# Patient Record
Sex: Female | Born: 1950 | Hispanic: Yes | Marital: Single | Smoking: Never smoker
Health system: Southern US, Community
[De-identification: ages and names within clinical notes are randomized; demographics above are authoritative.]

## PROBLEM LIST (undated history)

## (undated) DIAGNOSIS — Z0000 Encounter for general adult medical examination without abnormal findings: Secondary | ICD-10-CM

## (undated) DIAGNOSIS — C801 Malignant (primary) neoplasm, unspecified: Secondary | ICD-10-CM

## (undated) DIAGNOSIS — E119 Type 2 diabetes mellitus without complications: Secondary | ICD-10-CM

## (undated) HISTORY — DX: Malignant (primary) neoplasm, unspecified: C80.1

## (undated) HISTORY — DX: Encounter for general adult medical examination without abnormal findings: Z00.00

## (undated) HISTORY — PX: OVARY SURGERY: SHX727

## (undated) HISTORY — PX: BACK SURGERY: SHX140

---

## 1980-01-30 HISTORY — PX: TUBAL LIGATION: SHX77

## 2017-01-29 HISTORY — PX: TOTAL THYROIDECTOMY: SHX2547

## 2020-01-04 ENCOUNTER — Emergency Department (HOSPITAL_COMMUNITY)
Admission: EM | Admit: 2020-01-04 | Discharge: 2020-01-06 | Disposition: A | Discharge status: Home / Self Care | Payer: 59 | Attending: Emergency Medicine | Admitting: Emergency Medicine

## 2020-01-04 ENCOUNTER — Other Ambulatory Visit: Payer: Self-pay

## 2020-01-04 DIAGNOSIS — F419 Anxiety disorder, unspecified: Secondary | ICD-10-CM | POA: Insufficient documentation

## 2020-01-04 DIAGNOSIS — Z20822 Contact with and (suspected) exposure to covid-19: Secondary | ICD-10-CM | POA: Diagnosis not present

## 2020-01-04 DIAGNOSIS — F331 Major depressive disorder, recurrent, moderate: Secondary | ICD-10-CM | POA: Insufficient documentation

## 2020-01-04 DIAGNOSIS — G4700 Insomnia, unspecified: Secondary | ICD-10-CM | POA: Insufficient documentation

## 2020-01-04 DIAGNOSIS — E039 Hypothyroidism, unspecified: Secondary | ICD-10-CM | POA: Diagnosis not present

## 2020-01-04 DIAGNOSIS — F309 Manic episode, unspecified: Secondary | ICD-10-CM

## 2020-01-04 DIAGNOSIS — R443 Hallucinations, unspecified: Secondary | ICD-10-CM | POA: Insufficient documentation

## 2020-01-04 DIAGNOSIS — Z639 Problem related to primary support group, unspecified: Secondary | ICD-10-CM

## 2020-01-04 LAB — RAPID URINE DRUG SCREEN, HOSP PERFORMED
Amphetamines: NOT DETECTED
Barbiturates: NOT DETECTED
Benzodiazepines: NOT DETECTED
Cocaine: NOT DETECTED
Opiates: NOT DETECTED
Tetrahydrocannabinol: NOT DETECTED

## 2020-01-04 LAB — CBC WITH DIFFERENTIAL/PLATELET
Abs Immature Granulocytes: 0.03 10*3/uL (ref 0.00–0.07)
Basophils Absolute: 0.1 10*3/uL (ref 0.0–0.1)
Basophils Relative: 1 %
Eosinophils Absolute: 0.1 10*3/uL (ref 0.0–0.5)
Eosinophils Relative: 2 %
HCT: 37.1 % (ref 36.0–46.0)
Hemoglobin: 12.5 g/dL (ref 12.0–15.0)
Immature Granulocytes: 0 %
Lymphocytes Relative: 28 %
Lymphs Abs: 2 10*3/uL (ref 0.7–4.0)
MCH: 29.9 pg (ref 26.0–34.0)
MCHC: 33.7 g/dL (ref 30.0–36.0)
MCV: 88.8 fL (ref 80.0–100.0)
Monocytes Absolute: 0.5 10*3/uL (ref 0.1–1.0)
Monocytes Relative: 6 %
Neutro Abs: 4.6 10*3/uL (ref 1.7–7.7)
Neutrophils Relative %: 63 %
Platelets: 379 10*3/uL (ref 150–400)
RBC: 4.18 MIL/uL (ref 3.87–5.11)
RDW: 12.1 % (ref 11.5–15.5)
WBC: 7.4 10*3/uL (ref 4.0–10.5)
nRBC: 0 % (ref 0.0–0.2)

## 2020-01-04 LAB — COMPREHENSIVE METABOLIC PANEL
ALT: 35 U/L (ref 0–44)
AST: 24 U/L (ref 15–41)
Albumin: 4.5 g/dL (ref 3.5–5.0)
Alkaline Phosphatase: 64 U/L (ref 38–126)
Anion gap: 11 (ref 5–15)
BUN: 10 mg/dL (ref 8–23)
CO2: 27 mmol/L (ref 22–32)
Calcium: 9.9 mg/dL (ref 8.9–10.3)
Chloride: 100 mmol/L (ref 98–111)
Creatinine, Ser: 0.71 mg/dL (ref 0.44–1.00)
GFR, Estimated: >60 mL/min (ref >60)
Glucose, Bld: 128 mg/dL — ABNORMAL HIGH (ref 70–99)
Potassium: 3.5 mmol/L (ref 3.5–5.1)
Sodium: 138 mmol/L (ref 135–145)
Total Bilirubin: 0.8 mg/dL (ref 0.3–1.2)
Total Protein: 7.7 g/dL (ref 6.5–8.1)

## 2020-01-04 LAB — RESP PANEL BY RT-PCR (FLU A&B, COVID) ARPGX2
Influenza A by PCR: NEGATIVE
Influenza B by PCR: NEGATIVE
SARS Coronavirus 2 by RT PCR: NEGATIVE

## 2020-01-04 LAB — TSH: TSH: 13.205 u[IU]/mL — ABNORMAL HIGH (ref 0.350–4.500)

## 2020-01-04 LAB — ETHANOL: Alcohol, Ethyl (B): <10 mg/dL (ref <10)

## 2020-01-04 NOTE — ED Triage Notes (Signed)
Pt here from home with daughter , daughter states that her mom has not been sleeping states that she hears people that are not there in the house , pt use to be meds for anxiety and  therapy  but has not been on meds since she has been living with her daughter over the last 3 yrs

## 2020-01-04 NOTE — ED Notes (Signed)
Dinner Trays Ordered @ [phone].

## 2020-01-04 NOTE — ED Notes (Signed)
No SI or HI

## 2020-01-04 NOTE — ED Notes (Signed)
Pt has a 71 yr old in the home

## 2020-01-04 NOTE — ED Provider Notes (Signed)
[facility] EMERGENCY DEPARTMENT Provider Note   CSN: [id number] Arrival date & time: 01/04/20  4970     History No chief complaint on file.   Alicia Kirby is a 69 y.o. female.  HPI   History is very limited, the patient speaks Spanish only, the family members not at the bedside, there was a daughter that brought the patient to the ER, she is not available at this minute but report to the nurse was that the patient has a history of anxiety, has not been taking medications for some time, unknown the name of the medicine or how long it has been.  It appears that recently the patient has had some increasing insomnia, speaking to people that are not there, no other history at this time.  Level 5 caveat applies secondary to language barrier, also related to anxiety and hallucinations, translator will need to be used  The patient definitely is speaking quickly according to the interpreter the patient has got a bit pressured speech, she is answering questions stating that she does hear things around the house but states that the daughter hears them to, the patient is convinced that the house is haunted, she paces the house at night and appears very anxious.  She used to be on medications when she lived in [place] but no longer takes them, she does not feel acutely depressed, the family member is worried about safety stating that the patient will often pick things up and act like she is going to hit her or throw them.  She has not had self-injurious behavior.  No past medical history on file.  There are no problems to display for this patient.   Hypothyroidism   OB History   No obstetric history on file.     No family history on file.  Social History   Tobacco Use  . Smoking status: Not on file  Substance Use Topics  . Alcohol use: Not on file  . Drug use: Not on file    Home Medications Prior to Admission medications   Not on File    Allergies     Patient has no known allergies.  Review of Systems   Review of Systems  Unable to perform ROS: Psychiatric disorder    Physical Exam Updated Vital Signs BP (!) 152/45   Pulse 77   Temp 98 F (36.7 C) (Oral)   Resp 16   SpO2 96%   Physical Exam Vitals and nursing note reviewed.  Constitutional:      General: She is not in acute distress.    Appearance: She is well-developed.  HENT:     Head: Normocephalic and atraumatic.     Mouth/Throat:     Pharynx: No oropharyngeal exudate.  Eyes:     General: No scleral icterus.       Right eye: No discharge.        Left eye: No discharge.     Conjunctiva/sclera: Conjunctivae normal.     Pupils: Pupils are equal, round, and reactive to light.  Neck:     Thyroid: No thyromegaly.     Vascular: No JVD.  Cardiovascular:     Rate and Rhythm: Normal rate and regular rhythm.     Heart sounds: Normal heart sounds. No murmur heard.  No friction rub. No gallop.   Pulmonary:     Effort: Pulmonary effort is normal. No respiratory distress.     Breath sounds: Normal breath sounds. No wheezing or rales.  Abdominal:     General: Bowel sounds are normal. There is no distension.     Palpations: Abdomen is soft. There is no mass.     Tenderness: There is no abdominal tenderness.  Musculoskeletal:        General: No tenderness. Normal range of motion.     Cervical back: Normal range of motion and neck supple.  Lymphadenopathy:     Cervical: No cervical adenopathy.  Skin:    General: Skin is warm and dry.     Findings: No erythema or rash.  Neurological:     Mental Status: She is alert.     Coordination: Coordination normal.  Psychiatric:     Comments: This patient has some pressured speech, appears mildly manic, pacing the room, not responding to internal stimuli     ED Results / Procedures / Treatments   Labs (all labs ordered are listed, but only abnormal results are displayed) Labs Reviewed  COMPREHENSIVE METABOLIC PANEL -  Abnormal; Notable for the following components:      Result Value   Glucose, Bld 128 (*)    All other components within normal limits  TSH - Abnormal; Notable for the following components:   TSH 13.205 (*)    All other components within normal limits  RESP PANEL BY RT-PCR (FLU A&B, COVID) ARPGX2  ETHANOL  RAPID URINE DRUG SCREEN, HOSP PERFORMED  CBC WITH DIFFERENTIAL/PLATELET    EKG None  Radiology No results found.  Procedures Procedures (including critical care time)  Medications Ordered in ED Medications - No data to display  ED Course  I have reviewed the triage vital signs and the nursing notes.  Pertinent labs & imaging results that were available during my care of the patient were reviewed by me and considered in my medical decision making (see chart for details).    MDM Rules/Calculators/A&P                          This patient does have some signs of mania or increased anxiety, she does not appear to be acutely dangerous and more upset about the interaction with her daughter stating that they fight or interact poorly at home sometimes.  The daughter does report that she will leave the house and go to another house of a family member for days at a time without letting her know and some other erratic behavior.  Pt has elevated TSH - likely needs to have synthroid increased - currently at 25 mcg - doubt this is the entire cuase of the patients sx - awaiting psych recommendations -   Change of shift - care signed out to Dr. Eulis Foster to follow up results and disposition accordingly.  Final Clinical Impression(s) / ED Diagnoses Final diagnoses:  Anxiety  Insomnia, unspecified type  Mania ([place])  Hypothyroidism, unspecified type      Noemi Chapel, MD 01/04/20 1609

## 2020-01-05 MED ORDER — ACETAMINOPHEN 325 MG PO TABS: 650.0000 mg | ORAL_TABLET | ORAL | Status: DC | PRN

## 2020-01-05 MED ORDER — ONDANSETRON HCL 4 MG PO TABS: 4.0000 mg | ORAL_TABLET | Freq: Three times a day (TID) | ORAL | Status: DC | PRN

## 2020-01-05 MED ORDER — ZOLPIDEM TARTRATE 5 MG PO TABS: 5.0000 mg | ORAL_TABLET | Freq: Every evening | ORAL | Status: DC | PRN

## 2020-01-05 MED ORDER — ALUM & MAG HYDROXIDE-SIMETH 200-200-20 MG/5ML PO SUSP: 30.0000 mL | Freq: Four times a day (QID) | ORAL | Status: DC | PRN

## 2020-01-05 NOTE — ED Notes (Signed)
Writer spoke with pt using translator. Pt had concerns about medication that she takes at home. Pt states that when she got here staff took her mediation for safety reasons, but she has not been given anything in the morning. Pt also had some concerns about what was going on and why there were so many people outside the door. Pt made aware that these people were here for safety reasons. Pt verbalized understanding.   This Probation officer called pharmacy to ask about pt medication. Pharmacy states that when they spoke with her prior that she denied taking medication at home. Pharmacy also called pt listed pharmacy and states that the pt does not take an medication. Pt is stable at this time. Will try to communicate with family in the AM to get a clear understanding of medication regimen.
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
## 2020-01-05 NOTE — ED Notes (Signed)
Patient was found with home meds bag at beside and came out to nurse station requesting medication for HA and to advised of her home medication need; RN retrieved all meds and placed label and placed at nurses station; interpreter machine was used; RN will request home meds be placed due to unknown Dispo at this time-Monique,RN

## 2020-01-05 NOTE — ED Notes (Signed)
Daughter Janace Hoard called at [phone] and utilized Patent attorney, updates on patient's placement plan and status given. Per Angie, please communicate any updates on patient to her only.

## 2020-01-05 NOTE — ED Provider Notes (Signed)
Emergency Medicine Observation Re-evaluation Note  Alicia Kirby is a 69 y.o. female, seen on rounds today.  Pt initially presented to the ED for complaints of No chief complaint on file. Currently, the patient is talking on phone   Physical Exam  BP (!) 157/64 (BP Location: Right Arm)   Pulse 80   Temp 97.7 F (36.5 C) (Oral)   Resp 18   SpO2 100%  Physical Exam General: looks well Cardiac: normal rate Lungs: normal  Psych:  ED Course / MDM  EKG:    I have reviewed the labs performed to date as well as medications administered while in observation.  Recent changes in the last 24 hours include   Plan  Current plan is for admission for psychiatric treatment  Patient is not under full IVC at this time.   Fransico Meadow, Hershal Coria 01/05/20 [place], MD 01/06/20 [phone]

## 2020-01-05 NOTE — ED Notes (Signed)
TTS in process 

## 2020-01-05 NOTE — Progress Notes (Signed)
Pt meets inpatient criteria per Lindon Romp, FNP. Referral information has been sent to the the following geriatric psychiatric facilities for review:  [facility]  [facility]     Disposition will continue to assist with inpatient placement needs.   Audree Camel, MSW, LCSW, [place] Clinical Social Worker II Disposition CSW [phone]

## 2020-01-05 NOTE — ED Notes (Signed)
Lunch Tray Ordered @ 1036. 

## 2020-01-05 NOTE — ED Notes (Signed)
Home medications were found in a bag behind nurses station. This RN calling pharmacy to get medications placed in the computer to be administered to pt at correct times.

## 2020-01-06 DIAGNOSIS — Z639 Problem related to primary support group, unspecified: Secondary | ICD-10-CM

## 2020-01-06 MED ORDER — METFORMIN HCL 500 MG PO TABS
500.0000 mg | ORAL_TABLET | Freq: Two times a day (BID) | ORAL | Status: DC
  Administered 2020-01-06 (×2): 500 mg via ORAL
  Filled 2020-01-06 (×2): qty 1

## 2020-01-06 MED ORDER — HYDROCHLOROTHIAZIDE 25 MG PO TABS
25.0000 mg | ORAL_TABLET | Freq: Every day | ORAL | Status: DC
  Administered 2020-01-06: 25 mg via ORAL
  Filled 2020-01-06: qty 1

## 2020-01-06 MED ORDER — VITAMIN B-12 1000 MCG PO TABS
1000.0000 ug | ORAL_TABLET | Freq: Every day | ORAL | Status: DC
  Administered 2020-01-06: 1000 ug via ORAL
  Filled 2020-01-06: qty 1

## 2020-01-06 MED ORDER — ASPIRIN EC 81 MG PO TBEC
81.0000 mg | DELAYED_RELEASE_TABLET | Freq: Every day | ORAL | Status: DC
  Administered 2020-01-06: 81 mg via ORAL
  Filled 2020-01-06: qty 1

## 2020-01-06 MED ORDER — LEVOTHYROXINE SODIUM 25 MCG PO TABS
25.0000 ug | ORAL_TABLET | Freq: Every day | ORAL | Status: DC
  Administered 2020-01-06: 25 ug via ORAL
  Filled 2020-01-06: qty 1

## 2020-01-06 MED ORDER — LISINOPRIL 20 MG PO TABS
40.0000 mg | ORAL_TABLET | Freq: Every day | ORAL | Status: DC
  Administered 2020-01-06: 40 mg via ORAL
  Filled 2020-01-06 (×2): qty 2

## 2020-01-06 MED ORDER — LEVOTHYROXINE SODIUM 25 MCG PO TABS: 25.0000 ug | ORAL_TABLET | Freq: Every day | ORAL | Status: DC

## 2020-01-06 MED ORDER — GABAPENTIN 100 MG PO CAPS
100.0000 mg | ORAL_CAPSULE | Freq: Once | ORAL | Status: AC
  Administered 2020-01-06: 100 mg via ORAL
  Filled 2020-01-06: qty 1

## 2020-01-06 MED ORDER — AMLODIPINE BESYLATE 5 MG PO TABS
2.5000 mg | ORAL_TABLET | Freq: Every day | ORAL | Status: DC
  Administered 2020-01-06: 2.5 mg via ORAL
  Filled 2020-01-06: qty 1

## 2020-01-06 NOTE — BH Assessment (Addendum)
TTS to see-Alicia Kirby/Clinician awaiting nursing to coordinate interpreter services in order to complete patient's TTS re-assessment. Patients re-assessment is pending.

## 2020-01-06 NOTE — ED Notes (Signed)
Medications had not been reconciled upon the beginning of the shift. A med tech had responded to reconcile the meds. Pt is a type 2 diabetic. Meds were temporarily left with Pt. I obtained med's and  Secured. Pt stated that she had not taken any of her medications. Pt complained of rash to her hand, MD notifed.

## 2020-01-06 NOTE — BH Assessment (Addendum)
Disposition:   Patient's nurse-Allen, NP, made aware that patient is psych cleared by Harriett Sine, NP. Her daughter will pick patient up from the ED approximately 3pm. Informed nursing that it seems to be f conflict between patient and daughter. Patient expressed during today's re-assessment. she wants to get her own apartment. In the event that she refuses to return home to daughter nursing informed that social work may need to intervene with discharge.

## 2020-01-06 NOTE — ED Notes (Signed)
Breakfast ordered 

## 2020-01-06 NOTE — ED Provider Notes (Signed)
  Physical Exam  BP (!) 146/48 (BP Location: Right Arm)   Pulse 76   Temp 98 F (36.7 C) (Oral)   Resp 17   SpO2 98%   Physical Exam  ED Course/Procedures     Procedures  MDM  Patient was cleared by pscyh. Patient was cleared for discharge but she was concerned for her safety. APS was contacted by psych. I was told that she has a friend she can stay with and she feels safe there. Stable for discharge       Drenda Freeze, MD 01/06/20 2100

## 2020-01-06 NOTE — ED Notes (Signed)
Writer spoke with son, Camillia Herter. He currently resides in [place] and would like to be included in pt care. Pt is agreeable with the son being apart of plan. Phone number [phone].

## 2020-01-06 NOTE — Social Work (Signed)
TOC Team met with Pt at bedside. [employer] Interpreter system was used, interpreter  # [id number].  Pt reports that she has a new apartment that will be ready soon and until that time, she has friends that she can stay with. Pt states that she will call a friend to come and pick her up from ED.  TOC Team updated RN.
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
## 2020-01-06 NOTE — Consult Note (Signed)
Telepsych Consultation   Location of Patient: [facility] Location of Provider: [facility]  Patient Identification: Alicia Kirby MRN:  [id number] Principal Diagnosis: Conflict between patient and family Diagnosis:  Principal Problem:   Conflict between patient and family   Total Time spent with patient: 30 minutes  HPI:  Reassessment: Patient seen via telepsych. Chart reviewed. [name] is a 69 year old female who was brought to the ED by her daughter, who she lives with. Her daughter reported that she had a history of anxiety, had not been taking medicine, was talking to people who were not there, and believed the house was haunted.  Patient seen with TTS counselor Marlou Porch and Page language interpreter. Patient appears pleasant and euthymic. She states that she had been unaware that her daughter was taking her to the ED and does not feel that she ever needed to be brought here. She states that she has been having conflict with her daughter and her daughter's significant other. She states her daughter's significant other, who also lives in the home, has been controlling of her daughter, which has impacted the relationship between patient and her daughter. She states the daughter's significant other wanted her to be taken to the hospital. She denies depressed mood or anxiety and denies taking psychiatric medications. She does not feel she needs mental health treatment. She states she would like to move out of her daughter's home but does not have alternate housing available. She denies any SI/HI/AVH. Denies paranoia. She shows no signs of responding to internal stimuli. No signs of paranoia. No delusional thought content expressed. She has been calm and shown no behavioral disturbances in the ED. She reports that she has been sleeping well at night. Denies any concerns that her house is haunted.  With patient's consent, attempted to call her son at number in prior notes  [phone] but number did not work. With patient's consent, called her daughter Prentice Docker and informed her patient does not meet criteria for inpatient behavioral health at this time as she is denying any SI/HI/AVH. Her daughter states she is able to pick up patient for discharge home and denies safety concerns for discharge.  Per TTS assessment: Alicia Kirby is an 69 y.o. female.  -Clinician reviewed note by Dr. Sabra Heck.  History is very limited, the patient speaks Spanish only, the family members not at the bedside, there was a daughter that brought the patient to the ER, she is not available at this minute but report to the nurse was that the patient has a history of anxiety, has not been taking medications for some time, unknown the name of the medicine or how long it has been. It appears that recently the patient has had some increasing insomnia, speaking to people that are not there, no other history at this time.  Patient is seen with the language interpreter machine in the room.  Pt primary language is Spanish.  Patient says she has no SI, HI or A/V hallucinations.  She said that her daughter brought her to hospital under false pretenses.  Clinician specifically asked if she talked to people not present or thought that the house was haunted.  Patient says "no" to both os these inquiries.    Patient says that she and daughter are in conflict with each other.  Patient says she wants to be in her own residence away from daughter.  Right now however she and daughter and grandchild are the only ones in [place].    Patient is  alert and oriented x4.  Her eye contact is good and she is not responding to internal stimuli.  Patient does not display delusional thought processes.  She reports appetite and sleep being WNL.  Patient has no previous hx of inpatient or outpatient psychiatric care.  Clinician did try to call patient's daughter, with her permission.  But got no answer.     Disposition: Patient shows no evidence of acute risk of harm to self or others and is psych cleared for discharge. Daughter updated. ED staff updated.  Past Psychiatric History: See above  Risk to Self: Suicidal Ideation: No Suicidal Intent: No Is patient at risk for suicide?: No Suicidal Plan?: No Access to Means: No What has been your use of drugs/alcohol within the last 12 months?: Denies How many times?: 0 Other Self Harm Risks: None Triggers for Past Attempts: None known Intentional Self Injurious Behavior: None Risk to Others: Homicidal Ideation: No Thoughts of Harm to Others: No Current Homicidal Intent: No Current Homicidal Plan: No Access to Homicidal Means: No Identified Victim: No one History of harm to others?: No Assessment of Violence: None Noted Violent Behavior Description: None reported Does patient have access to weapons?: No Criminal Charges Pending?: No Does patient have a court date: No Prior Inpatient Therapy: Prior Inpatient Therapy: No Prior Outpatient Therapy: Prior Outpatient Therapy: No Does patient have an ACCT team?: No Does patient have Intensive In-House Services?  : No Does patient have [facility] services? : No Does patient have P4CC services?: No  Past Medical History: No past medical history on file.  Family History: No family history on file. Family Psychiatric  History: Unknown Social History:  Social History   Substance and Sexual Activity  Alcohol Use Not on file     Social History   Substance and Sexual Activity  Drug Use Not on file    Social History   Socioeconomic History  . Marital status: Single    Spouse name: Not on file  . Number of children: Not on file  . Years of education: Not on file  . Highest education level: Not on file  Occupational History  . Not on file  Tobacco Use  . Smoking status: Not on file  Substance and Sexual Activity  . Alcohol use: Not on file  . Drug use: Not on file  . Sexual  activity: Not on file  Other Topics Concern  . Not on file  Social History Narrative  . Not on file   Social Determinants of Health   Financial Resource Strain:   . Difficulty of Paying Living Expenses: Not on file  Food Insecurity:   . Worried About Charity fundraiser in the Last Year: Not on file  . Ran Out of Food in the Last Year: Not on file  Transportation Needs:   . Lack of Transportation (Medical): Not on file  . Lack of Transportation (Non-Medical): Not on file  Physical Activity:   . Days of Exercise per Week: Not on file  . Minutes of Exercise per Session: Not on file  Stress:   . Feeling of Stress : Not on file  Social Connections:   . Frequency of Communication with Friends and Family: Not on file  . Frequency of Social Gatherings with Friends and Family: Not on file  . Attends Religious Services: Not on file  . Active Member of Clubs or Organizations: Not on file  . Attends Archivist Meetings: Not on file  . Marital  Status: Not on file   Additional Social History:    Allergies:  No Known Allergies  Labs: No results found for this or any previous visit (from the past 48 hour(s)).  Medications:  Current Facility-Administered Medications  Medication Dose Route Frequency Provider Last Rate Last Admin  . acetaminophen (TYLENOL) tablet 650 mg  650 mg Oral Q4H PRN Caryl Ada K, PA-C      . alum & mag hydroxide-simeth (MAALOX/MYLANTA) 200-200-20 MG/5ML suspension 30 mL  30 mL Oral Q6H PRN Sofia, Leslie K, PA-C      . amLODipine (NORVASC) tablet 2.5 mg  2.5 mg Oral Daily Maudie Flakes, MD   2.5 mg at 01/06/20 1054  . aspirin EC tablet 81 mg  81 mg Oral Daily Maudie Flakes, MD   81 mg at 01/06/20 1050  . hydrochlorothiazide (HYDRODIURIL) tablet 25 mg  25 mg Oral Daily Maudie Flakes, MD   25 mg at 01/06/20 1050  . levothyroxine (SYNTHROID) tablet 25 mcg  25 mcg Oral Q0600 Maudie Flakes, MD      . lisinopril (ZESTRIL) tablet 40 mg  40 mg Oral  Daily Maudie Flakes, MD   40 mg at 01/06/20 1053  . metFORMIN (GLUCOPHAGE) tablet 500 mg  500 mg Oral BID WC Maudie Flakes, MD   500 mg at 01/06/20 1100  . ondansetron (ZOFRAN) tablet 4 mg  4 mg Oral Q8H PRN Sofia, Leslie K, PA-C      . vitamin B-12 (CYANOCOBALAMIN) tablet 1,000 mcg  1,000 mcg Oral Daily Maudie Flakes, MD   1,000 mcg at 01/06/20 1053  . zolpidem (AMBIEN) tablet 5 mg  5 mg Oral QHS PRN Fransico Meadow, PA-C       Current Outpatient Medications  Medication Sig Dispense Refill  . amLODipine (NORVASC) 2.5 MG tablet Take 2.5 mg by mouth daily.    Marland Kitchen aspirin EC 81 MG tablet Take 81 mg by mouth daily. Swallow whole.    . hydrochlorothiazide (HYDRODIURIL) 25 MG tablet Take 25 mg by mouth daily.    Marland Kitchen levothyroxine (SYNTHROID) 25 MCG tablet Take 25 mcg by mouth daily before breakfast.    . lisinopril (ZESTRIL) 40 MG tablet Take 40 mg by mouth daily.    . metFORMIN (GLUCOPHAGE) 500 MG tablet Take 500 mg by mouth 2 (two) times daily with a meal.    . vitamin B-12 (CYANOCOBALAMIN) 1000 MCG tablet Take 1,000 mcg by mouth daily.      Psychiatric Specialty Exam: Physical Exam  Review of Systems  Blood pressure (!) 146/48, pulse 76, temperature 98 F (36.7 C), temperature source Oral, resp. rate 17, SpO2 98 %.There is no height or weight on file to calculate BMI.  General Appearance: Casual  Eye Contact:  Good  Speech:  Normal Rate  Volume:  Normal  Mood:  Euthymic  Affect:  Appropriate and Congruent  Thought Process:  Coherent and Goal Directed  Orientation:  Full (Time, Place, and Person)  Thought Content:  Logical  Suicidal Thoughts:  No  Homicidal Thoughts:  No  Memory:  Immediate;   Fair Recent;   Fair Remote;   Fair  Judgement:  Fair  Insight:  Fair  Psychomotor Activity:  Normal  Concentration:  Concentration: Fair and Attention Span: Fair  Recall:  [employer] of Knowledge:  Fair  Language:  Fair  Akathisia:  No  Handed:  Right  AIMS (if indicated):      Assets:  Communication Skills Desire  for Improvement Housing Resilience Social Support  ADL's:  Intact  Cognition:  WNL  Sleep:       Disposition: Patient shows no evidence of acute risk of harm to self or others and is psych cleared for discharge. Daughter updated. ED staff updated.  This service was provided via telemedicine using a 2-way, interactive audio and video technology with the identified patient, Spanish language interpreter, TTS counselor Waldon Merl, and this Probation officer.  Connye Burkitt, NP 01/06/2020 2:56 PM

## 2020-01-06 NOTE — Discharge Instructions (Signed)
Please continue your current meds including your thyroid medicine.   See a counselor and your primary care doctor for follow up   Return to ER if you have worse anxiety, not feeling safe, hallucinations

## 2020-01-06 NOTE — ED Notes (Signed)
TTS

## 2020-01-06 NOTE — ED Notes (Signed)
RN spoke with patient via Interpreter machine regarding securing a ride and place to stay til her Apartment is ready; Pt is making phone calls at this time; EDP notified of impending d/c-Monique,RN

## 2020-01-06 NOTE — ED Provider Notes (Signed)
This is a 69 year old female seen on rounds today.  She is here for signs of mania and increased anxiety.  She is currently waiting for psych admission.  She is not under IVC at this time.  She does have elevated TSH in which her previous providers had noted with recommendation to increase her Synthroid.  However, apparently patient have not been taking her medication for the past several weeks.  Therefore, patient should resume her usual dose.  At this time, her only complaint is a rash to her right hand and burning sensation of her right arm which has been ongoing for approximately 15 days.  She denies prior history of shingles.  Examination of the right arm was noted for some mild skin irritation to the dorsum of the hand.  No obvious shingle rash noted.  Given the burn complaints, will provide gabapentin for symptom control.  4:50 PM The patient has been evaluated by psychiatry and is clearly for discharge psychiatrically.  Patient is also medically cleared.  Patient however voiced concern about returning back to her home as she does not feel safe living with her family members.  Social worker has interviewed and is involved but at this time there is no placement available she does not qualify for placement.  We will have adult protective service to be involved, and will offer patient shelter if she is willing otherwise she will be discharged back to her living domicile.  BP (!) 146/54 (BP Location: Left Arm)   Pulse 78   Temp 98.2 F (36.8 C) (Oral)   Resp 16   SpO2 100%   Results for orders placed or performed during the hospital encounter of 01/04/20  Resp Panel by RT-PCR (Flu A&B, Covid) Nasopharyngeal Swab   Specimen: Nasopharyngeal Swab; Nasopharyngeal(NP) swabs in vial transport medium  Result Value Ref Range   SARS Coronavirus 2 by RT PCR NEGATIVE NEGATIVE   Influenza A by PCR NEGATIVE NEGATIVE   Influenza B by PCR NEGATIVE NEGATIVE  Comprehensive metabolic panel  Result Value Ref  Range   Sodium 138 135 - 145 mmol/L   Potassium 3.5 3.5 - 5.1 mmol/L   Chloride 100 98 - 111 mmol/L   CO2 27 22 - 32 mmol/L   Glucose, Bld 128 (H) 70 - 99 mg/dL   BUN 10 8 - 23 mg/dL   Creatinine, Ser 0.71 0.44 - 1.00 mg/dL   Calcium 9.9 8.9 - 10.3 mg/dL   Total Protein 7.7 6.5 - 8.1 g/dL   Albumin 4.5 3.5 - 5.0 g/dL   AST 24 15 - 41 U/L   ALT 35 0 - 44 U/L   Alkaline Phosphatase 64 38 - 126 U/L   Total Bilirubin 0.8 0.3 - 1.2 mg/dL   GFR, Estimated >60 >60 mL/min   Anion gap 11 5 - 15  Ethanol  Result Value Ref Range   Alcohol, Ethyl (B) <10 <10 mg/dL  Urine rapid drug screen (hosp performed)  Result Value Ref Range   Opiates NONE DETECTED NONE DETECTED   Cocaine NONE DETECTED NONE DETECTED   Benzodiazepines NONE DETECTED NONE DETECTED   Amphetamines NONE DETECTED NONE DETECTED   Tetrahydrocannabinol NONE DETECTED NONE DETECTED   Barbiturates NONE DETECTED NONE DETECTED  CBC with Diff  Result Value Ref Range   WBC 7.4 4.0 - 10.5 K/uL   RBC 4.18 3.87 - 5.11 MIL/uL   Hemoglobin 12.5 12.0 - 15.0 g/dL   HCT 37.1 36 - 46 %   MCV 88.8 80.0 -  100.0 fL   MCH 29.9 26.0 - 34.0 pg   MCHC 33.7 30.0 - 36.0 g/dL   RDW 12.1 11.5 - 15.5 %   Platelets 379 150 - 400 K/uL   nRBC 0.0 0.0 - 0.2 %   Neutrophils Relative % 63 %   Neutro Abs 4.6 1.7 - 7.7 K/uL   Lymphocytes Relative 28 %   Lymphs Abs 2.0 0.7 - 4.0 K/uL   Monocytes Relative 6 %   Monocytes Absolute 0.5 0.1 - 1.0 K/uL   Eosinophils Relative 2 %   Eosinophils Absolute 0.1 0.0 - 0.5 K/uL   Basophils Relative 1 %   Basophils Absolute 0.1 0.0 - 0.1 K/uL   Immature Granulocytes 0 %   Abs Immature Granulocytes 0.03 0.00 - 0.07 K/uL  TSH  Result Value Ref Range   TSH 13.205 (H) 0.350 - 4.500 uIU/mL   No results found.    Domenic Moras, PA-C 01/06/20 1652    Maudie Flakes, MD 01/07/20 0700

## 2020-01-19 ENCOUNTER — Other Ambulatory Visit: Payer: Self-pay

## 2020-01-19 ENCOUNTER — Encounter (HOSPITAL_COMMUNITY): Payer: Self-pay

## 2020-01-19 ENCOUNTER — Emergency Department (HOSPITAL_COMMUNITY)
Admission: EM | Admit: 2020-01-19 | Discharge: 2020-01-20 | Disposition: A | Discharge status: Home / Self Care | Payer: 59 | Attending: Emergency Medicine | Admitting: Emergency Medicine

## 2020-01-19 DIAGNOSIS — R609 Edema, unspecified: Secondary | ICD-10-CM

## 2020-01-19 DIAGNOSIS — Z7984 Long term (current) use of oral hypoglycemic drugs: Secondary | ICD-10-CM | POA: Diagnosis not present

## 2020-01-19 DIAGNOSIS — E1140 Type 2 diabetes mellitus with diabetic neuropathy, unspecified: Secondary | ICD-10-CM | POA: Diagnosis not present

## 2020-01-19 DIAGNOSIS — I10 Essential (primary) hypertension: Secondary | ICD-10-CM | POA: Insufficient documentation

## 2020-01-19 DIAGNOSIS — Z79899 Other long term (current) drug therapy: Secondary | ICD-10-CM | POA: Diagnosis not present

## 2020-01-19 DIAGNOSIS — Z7982 Long term (current) use of aspirin: Secondary | ICD-10-CM | POA: Diagnosis not present

## 2020-01-19 DIAGNOSIS — R42 Dizziness and giddiness: Secondary | ICD-10-CM | POA: Diagnosis not present

## 2020-01-19 DIAGNOSIS — G629 Polyneuropathy, unspecified: Secondary | ICD-10-CM

## 2020-01-19 HISTORY — DX: Type 2 diabetes mellitus without complications: E11.9

## 2020-01-19 LAB — CBC
HCT: 39.5 % (ref 36.0–46.0)
Hemoglobin: 13.2 g/dL (ref 12.0–15.0)
MCH: 30.1 pg (ref 26.0–34.0)
MCHC: 33.4 g/dL (ref 30.0–36.0)
MCV: 90.2 fL (ref 80.0–100.0)
Platelets: 376 10*3/uL (ref 150–400)
RBC: 4.38 MIL/uL (ref 3.87–5.11)
RDW: 12.8 % (ref 11.5–15.5)
WBC: 9.3 10*3/uL (ref 4.0–10.5)
nRBC: 0 % (ref 0.0–0.2)

## 2020-01-19 LAB — BASIC METABOLIC PANEL
Anion gap: 11 (ref 5–15)
BUN: 17 mg/dL (ref 8–23)
CO2: 25 mmol/L (ref 22–32)
Calcium: 9.7 mg/dL (ref 8.9–10.3)
Chloride: 100 mmol/L (ref 98–111)
Creatinine, Ser: 0.82 mg/dL (ref 0.44–1.00)
GFR, Estimated: >60 mL/min (ref >60)
Glucose, Bld: 130 mg/dL — ABNORMAL HIGH (ref 70–99)
Potassium: 3.5 mmol/L (ref 3.5–5.1)
Sodium: 136 mmol/L (ref 135–145)

## 2020-01-19 NOTE — ED Triage Notes (Signed)
Per stratus interpretor patient complains of right foot swelling x 1 week. On assessment no swelling noted, also complains of left foot as well. Reports that her feet feel heavy. Patient alert and oriented. Also complains of right knuckle swelling x 2 weeks with dryness to same.

## 2020-01-20 ENCOUNTER — Emergency Department (HOSPITAL_COMMUNITY)
Admit: 2020-01-20 | Discharge: 2020-01-20 | Disposition: A | Discharge status: Home / Self Care | Payer: 59 | Attending: Emergency Medicine | Admitting: Emergency Medicine

## 2020-01-20 ENCOUNTER — Other Ambulatory Visit: Payer: Self-pay

## 2020-01-20 ENCOUNTER — Emergency Department (HOSPITAL_COMMUNITY): Payer: 59

## 2020-01-20 LAB — BRAIN NATRIURETIC PEPTIDE: B Natriuretic Peptide: 34.4 pg/mL (ref 0.0–100.0)

## 2020-01-20 MED ORDER — SODIUM CHLORIDE 0.9 % IV BOLUS
1000.0000 mL | Freq: Once | INTRAVENOUS | Status: AC
  Administered 2020-01-20: 17:00:00 1000 mL via INTRAVENOUS

## 2020-01-20 NOTE — ED Notes (Signed)
Son called and updated via phone on patient status.

## 2020-01-20 NOTE — Discharge Instructions (Addendum)
Continue taking your home medications as previously prescribed. It is important for you to follow-up with a primary care provider listed below for further evaluation of your chronic medical issues. Your CT did not show any evidence of a stroke or bleed.  Your lab work here did not show any significant abnormalities.   Return to the ER if you start to experience worsening swelling, chest pain, shortness of breath, blurry vision, numbness in arms or legs.  [facility] [place] [facility]: Wonda Horner [address] [place], [place] [address] [place] [place], [place] [address]  [id number], [place] [address] Correo estatal #: [id number] Telfono: [phone] Nmero de fax: [phone] Telfono de emergencia: [phone]  Si usted es un individuo o una familia que necesita asistencia para la vivienda debido a la falta de vivienda, comunquese con [place] [employer] llamando al 2-1-1 o visite su sitio web [id number].  Para obtener ms informacin Advertising account executive de viviendas para Special educational needs teacher u otros temas relacionados con la vivienda, comunquese con: Consultora de Programa de Raymond Gurney, [place] que acaban con la homelssness [phone]

## 2020-01-20 NOTE — ED Notes (Signed)
Reviewed discharge instructions with patient using interpreter. Follow-up care and social worker resources reviewed. Patient verbalized understanding. Patient A&Ox4, VSS, and ambulatory with steady gait upon discharge.

## 2020-01-20 NOTE — Progress Notes (Signed)
CSW met with Pt at bedside. With the utilization of [employer] video interpreter services # [phone] and # [phone] CSW was able to discuss with Pt the fact that Pt is currently staying with friends but will need other housing options soon.  CSW provided [place] housing and senior resource information translated into [place].  With interpreter CSW was able to explain to Pt process for reaching out to DSS for assistance.  CSW also included [facility] resource. CSW added resources to AVS. Pt verbalized understanding.  01/20/20 1952  TOC ED Mini Assessment  TOC Time spent with patient (minutes): 60  PING Used in TOC Assessment No  Admission or Readmission Diverted Yes  Interventions which prevented an admission or readmission Homeless Screening  What brought you to the Emergency Department?  Foot swelling  Barriers to Discharge No Barriers Identified  Means of departure Not know

## 2020-01-20 NOTE — ED Provider Notes (Signed)
Patient received in sign-out from [facility], PA-C. Patient awaiting completion of diagnostic studies in the evaluation of leg swelling. DVT, ECG, and BNP pending.  4:54 PM Received call from vascular that DVT study has been completed, but they are having trouble getting the report into EPIC. No indication of DVT.  6:45 PM Diagnostic studies and lab results reviewed and shared with patient. Patient endorses that she is homeless, and is requesting assistance. 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 TOC team consulted to assess/assist patient prior to discharge.  7:29 PM  TOC team has seen patient, with resource materials provided.  Results for orders placed or performed during the hospital encounter of 01/19/20  CBC  Result Value Ref Range   WBC 9.3 4.0 - 10.5 K/uL   RBC 4.38 3.87 - 5.11 MIL/uL   Hemoglobin 13.2 12.0 - 15.0 g/dL   HCT 39.5 36.0 - 46.0 %   MCV 90.2 80.0 - 100.0 fL   MCH 30.1 26.0 - 34.0 pg   MCHC 33.4 30.0 - 36.0 g/dL   RDW 12.8 11.5 - 15.5 %   Platelets 376 150 - 400 K/uL   nRBC 0.0 0.0 - 0.2 %  Basic metabolic panel  Result Value Ref Range   Sodium 136 135 - 145 mmol/L   Potassium 3.5 3.5 - 5.1 mmol/L   Chloride 100 98 - 111 mmol/L   CO2 25 22 - 32 mmol/L   Glucose, Bld 130 (H) 70 - 99 mg/dL   BUN 17 8 - 23 mg/dL   Creatinine, Ser 0.82 0.44 - 1.00 mg/dL   Calcium 9.7 8.9 - 10.3 mg/dL   GFR, Estimated >60 >60 mL/min   Anion gap 11 5 - 15  Brain natriuretic peptide  Result Value Ref Range   B Natriuretic Peptide 34.4 0.0 - 100.0 pg/mL   CT Head Wo Contrast  Result Date: 01/20/2020 CLINICAL DATA:  Vertigo.  Syncope. EXAM: CT HEAD WITHOUT CONTRAST TECHNIQUE: Contiguous axial images were obtained from the base of the skull through the vertex without intravenous contrast. COMPARISON:  None. FINDINGS: Brain: No evidence of acute large vascular territory infarction, hemorrhage,  hydrocephalus, extra-axial collection or mass lesion/mass effect. Bilateral basal ganglia mineralization. Partially empty sella. Vascular: Calcific atherosclerosis. Skull: No acute fracture. Sinuses/Orbits: Mild ethmoid air cell mucosal thickening. No air-fluid levels. Unremarkable orbits. Other: No mastoid or middle ear effusions. IMPRESSION: No evidence of acute intracranial abnormality. Electronically Signed   By: Margaretha Sheffield MD   On: 01/20/2020 13:42   VAS [place] LOWER EXTREMITY VENOUS (DVT) (ONLY [facility] & [facility] 7a-7p)  Result Date: 01/20/2020  Lower Venous DVT Study Other             BLE edema from knees to feet and pain going on for several Indications:      weeks. Performing Technologist: Rogelia Rohrer  Examination Guidelines: A complete evaluation includes B-mode imaging, spectral Doppler, color Doppler, and power Doppler as needed of all accessible portions of each vessel. Bilateral testing is considered an integral part of a complete examination. Limited examinations for reoccurring indications may be performed as noted. The reflux portion of the exam is performed with the patient in reverse Trendelenburg.  +---------+---------------+---------+-----------+----------+--------------+  RIGHT     Compressibility Phasicity Spontaneity Properties Thrombus Aging  +---------+---------------+---------+-----------+----------+--------------+  CFV       Full  Yes       Yes                                    +---------+---------------+---------+-----------+----------+--------------+  SFJ       Full                                                             +---------+---------------+---------+-----------+----------+--------------+  FV Prox   Full            Yes       Yes                                    +---------+---------------+---------+-----------+----------+--------------+  FV Mid    Full            Yes       Yes                                     +---------+---------------+---------+-----------+----------+--------------+  FV Distal Full            Yes       Yes                                    +---------+---------------+---------+-----------+----------+--------------+  PFV       Full                                                             +---------+---------------+---------+-----------+----------+--------------+  POP       Full            Yes       Yes                                    +---------+---------------+---------+-----------+----------+--------------+  PTV       Full                                                             +---------+---------------+---------+-----------+----------+--------------+  PERO      Full                                                             +---------+---------------+---------+-----------+----------+--------------+   +---------+---------------+---------+-----------+----------+--------------+  LEFT      Compressibility Phasicity Spontaneity Properties Thrombus Aging  +---------+---------------+---------+-----------+----------+--------------+  CFV       Full  Yes       Yes                                    +---------+---------------+---------+-----------+----------+--------------+  SFJ       Full                                                             +---------+---------------+---------+-----------+----------+--------------+  FV Prox   Full            Yes       Yes                                    +---------+---------------+---------+-----------+----------+--------------+  FV Mid    Full            Yes       Yes                                    +---------+---------------+---------+-----------+----------+--------------+  FV Distal Full            Yes       Yes                                    +---------+---------------+---------+-----------+----------+--------------+  PFV       Full                                                              +---------+---------------+---------+-----------+----------+--------------+  POP       Full            Yes       Yes                                    +---------+---------------+---------+-----------+----------+--------------+  PTV       Full                                                             +---------+---------------+---------+-----------+----------+--------------+  PERO      Full                                                             +---------+---------------+---------+-----------+----------+--------------+     Summary: RIGHT: - There is no evidence of deep vein thrombosis in the lower extremity.  - No cystic structure found in the popliteal fossa.  LEFT: - There is no evidence of deep vein thrombosis in the lower extremity.  - No cystic structure found in the popliteal fossa.  *See table(s) above for measurements and observations.    Preliminary      Patient appears safe for discharge at this time. Care instructions and return precautions provided.   Etta Quill, NP 01/20/20 1932    Drenda Freeze, MD 01/22/20 [phone]

## 2020-01-20 NOTE — ED Notes (Signed)
Pt transported to [facility] at this time

## 2020-01-20 NOTE — ED Provider Notes (Signed)
[facility] DEPART[facility] Provider Note   CSN: [id number] Arrival date & time: 01/19/20  1747     History No chief complaint on file.   Alicia Kirby is a 69 y.o. female with a past medical history of NIDDM, hypertension presenting to the ED with multiple complaints. Her first complaint is lightheadedness and dizziness for the past week.  She cannot recall any incident that may have triggered the symptoms although is concerned the symptoms may have been going on since her discharge at the hospital.  She was admitted earlier in the month for psych jobs.  She denies any headache, blurry vision, numbness in arms or legs, injuries or falls, anticoagulant use, vomiting, fever or neck stiffness.  She reports normal p.o. intake. Also complains of bilateral lower extremity edema, right greater than left.  This has been going on for several weeks.  She reports paresthesias to bilateral feet and pain to the bottom of her foot.  She has not taken medications to help with the symptoms.  She would like to be checked for a DVT as she is very afraid of this. Patient also requesting referral to a primary care provider.  The history is provided by the patient. The history is limited by a language barrier. A language interpreter was used.       Past Medical History:  Diagnosis Date  . Diabetes mellitus without complication [facility])     Patient Active Problem List   Diagnosis Date Noted  . Conflict between patient and family 01/06/2020    History reviewed. No pertinent surgical history.   OB History   No obstetric history on file.     No family history on file.     Home Medications Prior to Admission medications   Medication Sig Start Date End Date Taking? Authorizing Provider  amLODipine (NORVASC) 2.5 MG tablet Take 2.5 mg by mouth daily.    [provider]  aspirin EC 81 MG tablet Take 81 mg by mouth daily. Swallow whole.    [provider]   hydrochlorothiazide (HYDRODIURIL) 25 MG tablet Take 25 mg by mouth daily.    [provider]  levothyroxine (SYNTHROID) 25 MCG tablet Take 25 mcg by mouth daily before breakfast.    [provider]  lisinopril (ZESTRIL) 40 MG tablet Take 40 mg by mouth daily.    [provider]  metFORMIN (GLUCOPHAGE) 500 MG tablet Take 500 mg by mouth 2 (two) times daily with a meal.    [provider]  vitamin B-12 (CYANOCOBALAMIN) 1000 MCG tablet Take 1,000 mcg by mouth daily.    [provider]    Allergies    Patient has no known allergies.  Review of Systems   Review of Systems  Constitutional: Negative for appetite change, chills and fever.  HENT: Negative for ear pain, rhinorrhea, sneezing and sore throat.   Eyes: Negative for photophobia and visual disturbance.  Respiratory: Negative for cough, chest tightness, shortness of breath and wheezing.   Cardiovascular: Positive for leg swelling. Negative for chest pain and palpitations.  Gastrointestinal: Negative for abdominal pain, blood in stool, constipation, diarrhea, nausea and vomiting.  Genitourinary: Negative for dysuria, hematuria and urgency.  Musculoskeletal: Negative for myalgias.  Skin: Negative for rash.  Neurological: Positive for dizziness and light-headedness. Negative for weakness.    Physical Exam Updated Vital Signs BP (!) 95/45 (BP Location: Right Arm)   Pulse 64   Temp 97.6 F (36.4 C) (Oral)   Resp 18  SpO2 100%   Physical Exam Vitals and nursing note reviewed.  Constitutional:      General: She is not in acute distress.    Appearance: She is well-developed and well-nourished.  HENT:     Head: Normocephalic and atraumatic.     Nose: Nose normal.  Eyes:     General: No scleral icterus.       Right eye: No discharge.        Left eye: No discharge.     Extraocular Movements: EOM normal.     Conjunctiva/sclera: Conjunctivae normal.     Pupils: Pupils are equal, round,  and reactive to light.  Cardiovascular:     Rate and Rhythm: Normal rate and regular rhythm.     Pulses: Intact distal pulses.     Heart sounds: Normal heart sounds. No murmur heard. No friction rub. No gallop.   Pulmonary:     Effort: Pulmonary effort is normal. No respiratory distress.     Breath sounds: Normal breath sounds.  Abdominal:     General: Bowel sounds are normal. There is no distension.     Palpations: Abdomen is soft.     Tenderness: There is no abdominal tenderness. There is no guarding.  Musculoskeletal:        General: No edema. Normal range of motion.     Cervical back: Normal range of motion and neck supple.     Comments: No significant edema noted to bilateral lower extremities.  2+ DP pulse palpated bilaterally.  Normal sensation to light touch of bilateral lower extremities.  No erythema noted.  No calf tenderness bilaterally.  Skin:    General: Skin is warm and dry.     Findings: No rash.  Neurological:     Mental Status: She is alert and oriented to person, place, and time.     Cranial Nerves: No cranial nerve deficit.     Sensory: No sensory deficit.     Motor: No weakness or abnormal muscle tone.     Coordination: Coordination normal.     Comments: Pupils reactive. No facial asymmetry noted. Cranial nerves appear grossly intact. Sensation intact to light touch on face, BUE and BLE. Strength 5/5 in BUE and BLE.   Psychiatric:        Mood and Affect: Mood and affect normal.     ED Results / Procedures / Treatments   Labs (all labs ordered are listed, but only abnormal results are displayed) Labs Reviewed  BASIC METABOLIC PANEL - Abnormal; Notable for the following components:      Result Value   Glucose, Bld 130 (*)    All other components within normal limits  CBC  BRAIN NATRIURETIC PEPTIDE    EKG None  Radiology CT Head Wo Contrast  Result Date: 01/20/2020 CLINICAL DATA:  Vertigo.  Syncope. EXAM: CT HEAD WITHOUT CONTRAST TECHNIQUE:  Contiguous axial images were obtained from the base of the skull through the vertex without intravenous contrast. COMPARISON:  None. FINDINGS: Brain: No evidence of acute large vascular territory infarction, hemorrhage, hydrocephalus, extra-axial collection or mass lesion/mass effect. Bilateral basal ganglia mineralization. Partially empty sella. Vascular: Calcific atherosclerosis. Skull: No acute fracture. Sinuses/Orbits: Mild ethmoid air cell mucosal thickening. No air-fluid levels. Unremarkable orbits. Other: No mastoid or middle ear effusions. IMPRESSION: No evidence of acute intracranial abnormality. Electronically Signed   By: Margaretha Sheffield MD   On: 01/20/2020 13:42    Procedures Procedures (including critical care time)  Medications Ordered in ED Medications  sodium chloride 0.9 % bolus 1,000 mL (has no administration in time range)    ED Course  I have reviewed the triage vital signs and the nursing notes.  Pertinent labs & imaging results that were available during my care of the patient were reviewed by me and considered in my medical decision making (see chart for details).    MDM Rules/Calculators/A&P                          69 year old female with past medical history of NIDDM, hypertension presenting to the ED with multiple complaints. 1.  Lightheadedness and dizziness for the past week.  No trigger that may have caused the symptoms.  She is concerned that the symptoms have actually been going on since her discharge from the hospital.  She was admitted earlier in the month for psych work-up and consult.  She was not started on any new medications.  Denies any headache, blurry vision, numbness in arms or legs.  No neurological deficits noted on exam.  No numbness or weakness, no facial asymmetry noted.  She is alert and oriented x4. CT of the head without any acute abnormalities, no evidence of hemorrhage or stroke.  CBC, BMP unremarkable.  I doubt this is due to an acute  neurological cause such as CVA or hemorrhage. 2.  Bilateral lower extremity edema, right greater than left.  She reports paresthesias and pain to the bottom of her foot as well.  She is concerned that she may have a DVT and would like to be checked for this.  On my exam there is no significant edema noted to both of her extremities.  Equal and intact distal pulses noted bilaterally.  Normal temperature no erythema noted.    She is pending a DVT study, EKG and BNP. If all of these are negative for emergent concern which I anticipate, she will be able to be discharged home to follow-up with the primary care provider that we have referred her to.   Care handed off to oncoming provider pending remainder of work-up.   Portions of this note were generated with [employer]. Dictation errors may occur despite best attempts at proofreading.  Final Clinical Impression(s) / ED Diagnoses Final diagnoses:  Neuropathy  Peripheral edema    Rx / DC Orders ED Discharge Orders    None       Delia Heady, PA-C 01/20/20 1512    Tegeler, Gwenyth Allegra, MD 01/20/20 1550

## 2020-01-20 NOTE — CV Procedure (Signed)
BLE venous duplex completed. Preliminary results given to PA at 1650   Results can be found under chart review under CV PROC. 01/20/2020 5:01 PM Jasmon Graffam RVT, RDMS

## 2020-01-20 NOTE — ED Notes (Signed)
2 unsuccessful IV attempts.

## 2020-02-05 ENCOUNTER — Encounter: Payer: Self-pay | Admitting: Family Medicine

## 2020-02-05 ENCOUNTER — Ambulatory Visit (INDEPENDENT_AMBULATORY_CARE_PROVIDER_SITE_OTHER): Payer: 59 | Admitting: Family Medicine

## 2020-02-05 ENCOUNTER — Other Ambulatory Visit: Payer: Self-pay

## 2020-02-05 VITALS — BP 170/82 | HR 78 | Wt 145.2 lb

## 2020-02-05 DIAGNOSIS — E890 Postprocedural hypothyroidism: Secondary | ICD-10-CM | POA: Diagnosis not present

## 2020-02-05 DIAGNOSIS — Z0000 Encounter for general adult medical examination without abnormal findings: Secondary | ICD-10-CM

## 2020-02-05 DIAGNOSIS — F410 Panic disorder [episodic paroxysmal anxiety] without agoraphobia: Secondary | ICD-10-CM

## 2020-02-05 DIAGNOSIS — E7800 Pure hypercholesterolemia, unspecified: Secondary | ICD-10-CM | POA: Insufficient documentation

## 2020-02-05 DIAGNOSIS — E039 Hypothyroidism, unspecified: Secondary | ICD-10-CM | POA: Insufficient documentation

## 2020-02-05 DIAGNOSIS — Z23 Encounter for immunization: Secondary | ICD-10-CM | POA: Diagnosis not present

## 2020-02-05 DIAGNOSIS — E119 Type 2 diabetes mellitus without complications: Secondary | ICD-10-CM | POA: Insufficient documentation

## 2020-02-05 DIAGNOSIS — I152 Hypertension secondary to endocrine disorders: Secondary | ICD-10-CM | POA: Diagnosis not present

## 2020-02-05 DIAGNOSIS — E538 Deficiency of other specified B group vitamins: Secondary | ICD-10-CM

## 2020-02-05 DIAGNOSIS — Z1211 Encounter for screening for malignant neoplasm of colon: Secondary | ICD-10-CM

## 2020-02-05 DIAGNOSIS — Z1231 Encounter for screening mammogram for malignant neoplasm of breast: Secondary | ICD-10-CM

## 2020-02-05 DIAGNOSIS — E1159 Type 2 diabetes mellitus with other circulatory complications: Secondary | ICD-10-CM | POA: Diagnosis not present

## 2020-02-05 DIAGNOSIS — E559 Vitamin D deficiency, unspecified: Secondary | ICD-10-CM | POA: Diagnosis not present

## 2020-02-05 DIAGNOSIS — R7989 Other specified abnormal findings of blood chemistry: Secondary | ICD-10-CM

## 2020-02-05 DIAGNOSIS — Z1159 Encounter for screening for other viral diseases: Secondary | ICD-10-CM

## 2020-02-05 DIAGNOSIS — F325 Major depressive disorder, single episode, in full remission: Secondary | ICD-10-CM

## 2020-02-05 DIAGNOSIS — Z780 Asymptomatic menopausal state: Secondary | ICD-10-CM

## 2020-02-05 HISTORY — DX: Other specified abnormal findings of blood chemistry: R79.89

## 2020-02-05 HISTORY — DX: Deficiency of other specified B group vitamins: E53.8

## 2020-02-05 HISTORY — DX: Panic disorder (episodic paroxysmal anxiety): F41.0

## 2020-02-05 LAB — POCT URINALYSIS DIP (MANUAL ENTRY)
Bilirubin, UA: NEGATIVE
Blood, UA: NEGATIVE
Glucose, UA: NEGATIVE mg/dL
Ketones, POC UA: NEGATIVE mg/dL
Leukocytes, UA: NEGATIVE
Nitrite, UA: NEGATIVE
Protein Ur, POC: NEGATIVE mg/dL
Spec Grav, UA: 1.025 (ref 1.010–1.025)
Urobilinogen, UA: 0.2 E.U./dL
pH, UA: 7 (ref 5.0–8.0)

## 2020-02-05 LAB — POCT UA - MICROALBUMIN
Albumin/Creatinine Ratio, Urine, POC: <30
Creatinine, POC: 50 mg/dL
Microalbumin Ur, POC: 10 mg/L

## 2020-02-05 LAB — POCT GLYCOSYLATED HEMOGLOBIN (HGB A1C): Hemoglobin A1C: 5.8 % — AB (ref 4.0–5.6)

## 2020-02-05 NOTE — Assessment & Plan Note (Signed)
Lipid panel ordered for future as patient is non-fasting today.

## 2020-02-05 NOTE — Assessment & Plan Note (Addendum)
Pt currently on amlodipine 2.5 mg, HCTZ 25 mg, lisinopril 40 mg, and asa 81 mg daily. Endorses medication compliance, no missed doses. BP today 170/82.  - no medication changes at this time as new patient - return in one month or less for visit dedicated to BP - check CMP today for potential electrolyte abnormalities 2/2 HCTZ

## 2020-02-05 NOTE — Assessment & Plan Note (Addendum)
Historical diagnosis per patient. Currently on metformin 500 mg BID. A1c today 5.8, within prediabetic range. Today's UA and urine microalbumin demonstrate no renal complications 2/2 diabetes.  - CMP today - referred for annual DM eye exam - recheck A1c in 3 months - consider d/c metformin with shared decision making, as prediabetes could be successfully managed with diet and exercise

## 2020-02-05 NOTE — Progress Notes (Signed)
SUBJECTIVE:   CHIEF COMPLAINT / HPI:   Establish as new patient Alicia Kirby is a pleasant 70 yo female who presents today to establish as a new patient with our clinic. Previously a patient of Dr. Edwena Felty in [place]. Lives in [place] and would like a physician closer to her. Originally from [place], has received medical care there as recently as 2018/2019.   Anxiety, panic attack Reports an unprovoked panic attack a week or so ago. Occurred in the evening, consisted of tachycardia and heart pounding. Wasn't sure why this was happening, it frightened her. Her previous physician advised starting with a therapist, but she has not yet connected with one as she is not sure how. Asks for referral today. Is very interested and motivated to speak with a therapist.   Recent ED evaluations for anxiety, BLE edema, tingling sensation Recent ED evaluations at [facility] ED on 12/06 an 12/21 for anxiety, BLE edema, and a tingling sensation of her bilateral feet and right arm. She reports the work up was negative and they weren't able to tell her what happened. Per chart review, CT head, EKG, and DVT US all negative. Recent labs notable for TSH of 13.205 on 12/6 when patient presented with symptoms of anxiety and possible mania (per ED provider). History of surgical thyroid ablation in [place] in 2019 with subsequent need for levothyroxine supplementation since. Today patient reports that she has not missed any doses of levothyroxine recently.   PERTINENT  PMH / PSH: HTN, Prediabetes, surgical hypothyroidism, low vitamin B12  OBJECTIVE:   BP (!) 170/82   Pulse 78   Wt 145 lb 3.2 oz (65.9 kg)   SpO2 99%    PHQ-9:  Depression screen  [facility] 2/9 02/05/2020  Decreased Interest 0  Down, Depressed, Hopeless 0  PHQ - 2 Score 0  Altered sleeping 0  Tired, decreased energy 0  Change in appetite 1  Feeling bad or failure about yourself  0  Trouble concentrating 0  Moving slowly or  fidgety/restless 0  Suicidal thoughts 0  PHQ-9 Score 1  Difficult doing work/chores Not difficult at all     GAD-7: No flowsheet data found.    Physical Exam General: Awake, alert, oriented HEENT: moderate cerumen burden of bilateral external auditory canals, bilateral TMs pink and flat with appropriate cone of light, nares normal without erythema, oral mucosa without lesion, several dental fillings, silver metal linear brace under under tongue extending between bilateral lower canines Cardiovascular: Regular rate and rhythm, S1 and S2 present, no murmurs auscultated Respiratory: Lung fields clear to auscultation bilaterally Abdomen: soft, non-distended, BS present, no TTP, rebound tenderness, or guarding Extremities: No bilateral lower extremity edema, palpable pedal and pretibial pulses bilaterally Neuro: Cranial nerves II through X grossly intact, able to move all extremities spontaneously   ASSESSMENT/PLAN:   Hypothyroid 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 Current dose levothyroxine 25 mcg. Last TSH in ED 01/04/2020 was 13.205, however patient endorsed missing many doses at that time. - recheck TSH today as she has been taking levothyroxine regularly for last month - adjust as indicated  Hypertension associated with diabetes ([place]) Pt currently on amlodipine 2.5 mg, HCTZ 25 mg, lisinopril 40 mg, and asa 81 mg daily. Endorses medication compliance, no missed doses. BP today 170/82.  - no medication changes at this time as new patient - return in one month or less for visit dedicated to BP - check CMP today for potential electrolyte abnormalities 2/2  HCTZ  Low vitamin B12 level Has been taking B12 supplement for last year per recommendation of previous PCP.  - recheck B12 level today - adjust as necessary  Diabetes mellitus without complication ([place]) Historical diagnosis per patient. Currently on metformin 500 mg BID. A1c today 5.8, within prediabetic  range. Today's UA and urine microalbumin demonstrate no renal complications 2/2 diabetes.  - CMP today - referred for annual DM eye exam - recheck A1c in 3 months - consider d/c metformin with shared decision making, as prediabetes could be successfully managed with diet and exercise  Panic attack Patient reports increasing anxiety. Had panic attack a week or so ago that frightened her. Would like to see a therapist. Explained lack of therapy referral process and provided resources to connect with therapist of choice. PHQ-9 score today of 1.  - follow up in future visits - Spanish GAD-7 at next visit  Healthcare maintenance New patient establishing with our clinic today. Recent CBC performed at ED visits in December.  - sent MyChart setup links to phone and email - obtained CMP, A1c, TSH, vit B12, vit D - ordered lipid panel as future lab due to non-fasting status today - Hep. C screening today - PNA vax today - ref for colonoscopy - ordered mammogram and DEXA scan  Hypercholesteremia Lipid panel ordered for future as patient is non-fasting today.   Depression, major, single episode, complete remission (HCC) PHQ-9 score of 1 today. No medications. - follow up at future visits     Ezequiel Essex, MD [place]

## 2020-02-05 NOTE — Assessment & Plan Note (Signed)
Patient reports increasing anxiety. Had panic attack a week or so ago that frightened her. Would like to see a therapist. Explained lack of therapy referral process and provided resources to connect with therapist of choice. PHQ-9 score today of 1.  - follow up in future visits - Spanish GAD-7 at next visit

## 2020-02-05 NOTE — Patient Instructions (Signed)
Fue maravilloso conocerte hoy. Gracias por permitirme ser parte de su cuidado. A continuacin se muestra un breve resumen de lo que discutimos en su visita de hoy: It was wonderful to meet you today. Thank you for allowing me to be a part of your care. Below is a short summary of what we discussed at your visit today:  Mantenimiento sanitario Hoy obtuvimos muchos laboratorios para Chief Technology Officer su salud y Therapist, sports. Enviaremos sus resultados normales a travs de Pharmacist, community o Market researcher. Si algo es anormal, lo llamaremos para informarle los [place]. Healthcare maintenance Today we obtained many labs to check on your health and wellbeing. We will send your normal results through MyChart or letter. If anything is abnormal, we will call you with the results.  Recursos de terapia Puede visitar el siguiente sitio web para Merchandiser, retail en [place] que hablen espaol y que sean adecuados para usted. Tambin imprim las listas de sitios web despus de una breve bsqueda. Tendr que llamar a estas oficinas directamente para asegurarse de que tomen su seguro y programen una cita. https://www.psychologytoday.com/us/ Therapy resources You may visit the following website to find therapists in [place] who speak [place] may be a good fit for you. I have also printed off the website listings after a brief search. You will have to call these offices directly to make sure they take your insurance and schedule an appointment. https://www.psychologytoday.com/us/  Exmenes de [employer] referir para exmenes de salud como una colonoscopia para Film/video editor de colon, Lavinia Sharps para detectar cncer de mama, una exploracin DEXA para detectar la densidad sea y un examen de la vista para asegurarse de que sus ojos estn bien sin complicaciones de la diabetes. Debera tener noticias de nuestra oficina con respecto a estas referencias y Scientist, research (life sciences). Health screenings I will refer you for health screenings such as a  colonoscopy to screen for colon cancer, a mammogram to screen for breast cancer, a DEXA scan to screen for bone density, and eye exam to make sure your eyes are doing well without complications from diabetes. You should be hearing from our office regarding these referrals and scheduling.  Si tiene alguna pregunta o inquietud, no dude en comunicarse con nosotros por telfono o por mensaje de MyChart. If you have any questions or concerns, please do not hesitate to contact us via phone or MyChart message.   Ezequiel Essex, MD    Control de la ansiedad en los adultos Managing Anxiety, Adult Despus de haber sido diagnosticado con trastorno de ansiedad, podra sentirse aliviado por comprender por qu se haba sentido o haba actuado de cierto modo. Es posible que tambin se sienta abrumado por el tratamiento que tiene por delante y por lo que este significar para su vida. Con atencin y [place], [place] afeccin y recuperarse. Cmo manejar los cambios en el estilo de vida Control del estrs y la ansiedad  El estrs es la reaccin del cuerpo ante los cambios y los acontecimientos de la vida, tanto buenos [place]. La State Farm de los episodios de estrs duran slo algunas horas, pero el estrs puede ser continuo y Forensic psychologist a ms que solo estrs. Aunque el estrs puede desempear un papel importante en la ansiedad, no es lo mismo que la ansiedad. El estrs generalmente es causado por algo externo, como una fecha lmite, una prueba o una competencia. El estrs normalmente pasa despus de que el evento desencadenante ha terminado.  La ansiedad es causada por algo interno, por ejemplo, imaginar un resultado terrible  o preocuparse porque algo ir mal y lo devastar. A menudo, la ansiedad no desaparece incluso despus de que el evento desencadenante ha finalizado y puede tornarse en una preocupacin a largo plazo (crnica). Es importante comprender las diferencias entre el estrs y la ansiedad, y  Chief Technology Officer el estrs de manera efectiva para que no genere una respuesta de ansiedad. Hable con el mdico o un consejero para obtener ms informacin sobre cmo reducir la ansiedad y Dealer. Es posible que el profesional sugiera tcnicas para reducir la tensin, tales como:  Musicoterapia. Esto podra incluir crear o escuchar msica que disfrute y lo inspire.  Meditacin consciente. Esto implica prestar atencin a la respiracin normal sin intentar controlarla. Puede realizarse mientras est sentado o camina.  Oracin centrante. Esto implica centrarse en una palabra, frase o imagen sagrada que le signifique algo y le genere paz.  Respiracin profunda. Para hacer esto, expanda el estmago e inhale lentamente por la nariz. [place] unos 3a5segundos. Luego, exhale lentamente mientras deja que los msculos del estmago se relajen.  Dilogo interno. Esto implica identificar patrones de pensamiento que provocan reacciones de ansiedad y cambiar esos patrones.  Relajacin muscular. Esto implica tensar los msculos y, [place], [place]. Elija una tcnica para reducir la tensin que se adapte a su estilo de vida y su personalidad. Estas tcnicas llevan tiempo y prctica. Resrvese de 5a39minutos por da para Ambulance person. Algunos terapeutas pueden ofrecer orientacin y capacitacin en estas tcnicas. Es posible que algunos planes de seguros mdicos cubran la capacitacin. Otras cosas que puede hacer para controlar el estrs y la ansiedad incluyen:  Llevar un diario de estrs/ansiedad. Esto puede ayudarlo a identificar qu le desencadena su reaccin y, Air traffic controller, aprender las maneras de controlar su respuesta al [place].  Pensar en cmo reacciona ante ciertas situaciones. Es posible que no sea capaz de Chief Technology Officer todo, pero puede Environmental education officer.  Hacerse tiempo para las actividades que lo ayudan a Nurse, children's y no sentir culpa por pasar su tiempo de [place].  La formacin de  imgenes visuales y el yoga pueden ayudarlo a Theatre manager la calma y [place].  Medicamentos Los medicamentos pueden ayudar a [employer]. [employer]. [employer]. Algunos medicamentos para la ansiedad:  Teacher, adult education ansiedad.  Antidepresivos. A menudo, los medicamentos se usan como tratamiento primario para el trastorno de [place]. Un mdico recetar los medicamentos. Cuando se usan juntos, los medicamentos, la psicoterapia y las tcnicas de reduccin de la tensin pueden ser el tratamiento ms efectivo. [place] interpersonales pueden ser muy importantes para ayudar a su recuperacin. Intente pasar ms tiempo interactuando con amigos y familiares de [place]. Considere la posibilidad de ir a terapia de pareja, tomar clases de educacin familiar o ir a Careers information officer. La terapia puede ayudarlos a usted y a los dems a comprender mejor su afeccin. Cmo Museum/gallery conservator en su ansiedad Cada persona responde de [place] diferente al tratamiento de la ansiedad. Se dice que est recuperado de la ansiedad cuando los sntomas disminuyen y dejan de Cabin crew en las actividades diarias en el hogar o [place]. Esto podra significar que usted comenzar a Field seismologist lo siguiente:  Associate Professor y atencin. Tener menos interferencia de la preocupacin en el pensamiento diario.  Dormir mejor.  Estar menos irritable.  Tener ms energa.  Tener [employer]. Es Glass blower/designer cundo el trastorno [place]. Comunquese con el mdico si sus sntomas interfieren en su hogar o su trabajo, y usted siente que su afeccin no est  mejorando. Siga estas instrucciones en su casa: Actividad  Realizar actividad fsica. La State Farm de los adultos debe hacer lo siguiente: ? Optometrist, al [place], 167minutos de actividad fsica por semana. El ejercicio debe aumentar la frecuencia cardaca y Nature conservation officer transpirar (ejercicio de intensidad moderada). ? Realizar ejercicios de fortalecimiento por  lo [employer] por semana.  Dormir bien y por el tiempo adecuado. La State Farm de los adultos necesitan entre 7y9horas de sueo todas las noches. Estilo de vida   Siga una dieta saludable que incluya abundantes frutas, verduras, cereales integrales, productos lcteos descremados y protenas magras. No consuma muchos alimentos ricos en grasas slidas, azcares agregados o sal.  Opte por cosas que le simplifiquen la vida.  No consuma ningn producto que contenga nicotina o tabaco, como cigarrillos, cigarrillos electrnicos y tabaco de Higher education careers adviser. Si necesita ayuda para dejar de fumar, consulte al mdico.  Evite el consumo de cafena, alcohol y ciertos medicamentos contra el resfro de venta sin receta. Estos podran Engineer, building services. Pregntele al farmacutico qu medicamentos no debera tomar. Instrucciones generales  Delphi de venta libre y los recetados solamente como se lo haya indicado el mdico.  Consulting civil engineer a todas las visitas de seguimiento como se lo haya indicado el mdico. Esto es importante. Dnde buscar apoyo Puede conseguir ayuda y [employer] siguientes lugares:  Grupos de [place].  Organizaciones comunitarias y en lnea.  Un lder espiritual de confianza.  Terapia de pareja.  Clases de educacin familiar.  Terapia familiar. Dnde buscar ms informacin Formar parte de un grupo de apoyo podra resultarle til para enfrentar la ansiedad. Las siguientes fuentes pueden ayudarlo a Orthoptist consejeros o grupos de apoyo cerca de su hogar:  [place] (Salud Mental de los Estados Unidos): www.mentalhealthamerica.net  Anxiety and Depression Association of [place] [ADAA] (Asociacin de Ansiedad y Depresin de los Estados Unidos): https://www.clark.net/  National Alliance on Mental Illness [NAMI] ([place]): www.nami.org Comunquese con un mdico si:  Le resulta difcil permanecer concentrado o finalizar las tareas  diarias.  Pasa muchas horas por da sintindose preocupado por la vida cotidiana.  La preocupacin le provoca un cansancio extremo.  Comienza a tener dolores de [place] o nuseas, o a sentirse tenso.  Orina ms de lo normal.  Tiene diarrea. Solicite ayuda inmediatamente si tiene:  Latidos cardacos acelerados y falta de aire.  Pensamientos acerca de Sport and exercise psychologist a Producer, television/film/video. Si alguna vez siente que puede lastimarse o Market researcher a Producer, television/film/video, o tiene pensamientos de poner fin a su vida, busque ayuda de inmediato. Puede dirigirse al servicio de emergencias ms cercano o comunicarse con:  El servicio de emergencias de su localidad (911 en EE.UU.).  Una lnea de asistencia al suicida y Freight forwarder en crisis, como National Suicide Prevention Lifeline ([place]), al (607) 245-2211. Est disponible las 24 horas del da. Resumen  Tomar medidas para aprender y usar tcnicas de reduccin de la tensin puede ayudarlo a calmarse y a Conservation officer, historic buildings reaccin de ansiedad.  Cuando se usan juntos, los medicamentos, la psicoterapia y las tcnicas de reduccin de la tensin pueden ser el tratamiento ms efectivo.  Los familiares, los amigos y las parejas pueden tener un lugar importante en su recuperacin del trastorno de ansiedad. Esta informacin no tiene Marine scientist el consejo del mdico. Asegrese de hacerle al mdico cualquier pregunta que tenga. Document Revised: 07/23/2018 Document Reviewed: 07/23/2018 Elsevier Patient Education  [place]  and Counseling Resources Most providers on this list will take Medicaid. Patients with commercial insurance or Medicare should contact their insurance company to get a list of in network providers.  BestDay:Psychiatry and Counseling [address] [facility] [address]. [place], [place] [address] [place]  [address] [place], [place] [address]       [place] [address]  [place], [place] [address] [phone]  [facility] [address] [place]  [place], [place] [address]       [phone]      Jinny Blossom [facility] [address], [place], [place]  Family Solutions:  [place]. [place] [phone]  Journeys Counseling:  [place] [address] Fate [phone]  [facility] (under & uninsured) [address], [place] [phone]    [email or web address]    [place] [address] . [place]    [phone]  [facility] [place]     Phone:  [phone]     [place]  [place] [address]      [place], [place] ext [place]: [place], [place], [place]   [place] ([place] therapist) [email or web address]  [place] [address]   [place] [address]    [phone]    [facility]   [address],  [place], [place]   [place]  [place]  [facility]  [address] [place], [place]        [phone]  Open Access/Walk In Clinic under & uninsured  [facility]  [address] [place], [place] Crisis [phone]  Family Service of the [place],  ([place])   [place], [place]: [phone]) 8:30 - 12; 1 - 2:30  Family Service of the [place],  [place], [place]    ([phone]):8:30 - 12; 2 - 3PM  [facility] [employer],  [address],  [place]; [phone]):   Mon - Fri 8 AM - 5 PM  [facility] Services [place]  MWF 12:30  to 3:00 or call to schedule an appointment  [phone]  Specific Provider options Psychology Today  https://www.psychologytoday.com/us 1. click on find a therapist  2. enter your zip code 3. left side and select or tailor a therapist for your specific need.   [facility] Provider Directory [email or web address]  (Medicaid)   Follow all drop down to find a provider  [place] [phone] or [email or web address] [address], [place], [place] Recovery support and educational   24- Hour Availability:  .  Marland Kitchen [facility]  . [place], [place] Crisis [phone]  . Family Service of the [employer] [phone]  [facility] Crisis Service  [phone]   . [place]  [phone] (after hours)  . Therapeutic Alternative/Mobile Crisis   [phone]  . [place] National Suicide Hotline  325-142-4497 ([place])  . Call 911 or go to emergency room  . [employer]  [phone]);  [place] and  [place]   . [place] ACCESS  [phone]); [place], [place], [place], [place], [place], [place], [place]

## 2020-02-05 NOTE — Assessment & Plan Note (Signed)
PHQ-9 score of 1 today. No medications. - follow up at future visits

## 2020-02-05 NOTE — Assessment & Plan Note (Signed)
Has been taking B12 supplement for last year per recommendation of previous PCP.  - recheck B12 level today - adjust as necessary

## 2020-02-05 NOTE — Assessment & Plan Note (Addendum)
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 Current dose levothyroxine 25 mcg. Last TSH in ED 01/04/2020 was 13.205, however patient endorsed missing many doses at that time. - recheck TSH today as she has been taking levothyroxine regularly for last month - adjust as indicated

## 2020-02-05 NOTE — Assessment & Plan Note (Signed)
New patient establishing with our clinic today. Recent CBC performed at ED visits in December.  - sent MyChart setup links to phone and email - obtained CMP, A1c, TSH, vit B12, vit D - ordered lipid panel as future lab due to non-fasting status today - Hep. C screening today - PNA vax today - ref for colonoscopy - ordered mammogram and DEXA scan

## 2020-02-06 LAB — HCV AB W REFLEX TO QUANT PCR: HCV Ab: <0.1 s/co ratio (ref 0.0–0.9)

## 2020-02-06 LAB — HEMOGLOBIN A1C
Est. average glucose Bld gHb Est-mCnc: 131 mg/dL
Hgb A1c MFr Bld: 6.2 % — ABNORMAL HIGH (ref 4.8–5.6)

## 2020-02-06 LAB — VITAMIN B12: Vitamin B-12: 1098 pg/mL (ref 232–1245)

## 2020-02-06 LAB — VITAMIN D 25 HYDROXY (VIT D DEFICIENCY, FRACTURES): Vit D, 25-Hydroxy: 17.2 ng/mL — ABNORMAL LOW (ref 30.0–100.0)

## 2020-02-06 LAB — HCV INTERPRETATION

## 2020-02-06 LAB — TSH: TSH: 19.8 u[IU]/mL — ABNORMAL HIGH (ref 0.450–4.500)

## 2020-02-08 ENCOUNTER — Telehealth: Payer: Self-pay | Admitting: Family Medicine

## 2020-02-08 ENCOUNTER — Other Ambulatory Visit: Payer: Self-pay | Admitting: Family Medicine

## 2020-02-08 ENCOUNTER — Encounter: Payer: Self-pay | Admitting: Family Medicine

## 2020-02-08 DIAGNOSIS — E890 Postprocedural hypothyroidism: Secondary | ICD-10-CM

## 2020-02-08 DIAGNOSIS — E559 Vitamin D deficiency, unspecified: Secondary | ICD-10-CM

## 2020-02-08 MED ORDER — LEVOTHYROXINE SODIUM 75 MCG PO TABS: 75.0000 ug | ORAL_TABLET | ORAL | 0 refills | Status: DC

## 2020-02-08 MED ORDER — CHOLECALCIFEROL 1.25 MG (50000 UT) PO TABS: 50000.0000 [IU] | ORAL_TABLET | ORAL | 0 refills | Status: DC

## 2020-02-08 NOTE — Addendum Note (Signed)
Addended by: Renard Hamper on: 02/08/2020 04:11 PM   Modules accepted: Orders

## 2020-02-08 NOTE — Progress Notes (Signed)
Several labs returned abnormal. A1c 6.2, in pre-diabetic range. Will counsel patient on diet and exercise. Continue metformin 500 mg BID for now.   TSH 19.800, increased from 13.205 one month ago. Per clinic visit 02/05/2020, patient reports not missing any doses since ED visit 01/04/2020. Reports taking first thing in the morning and waiting 30 minutes before eating or drinking. Will increase levothyroxine from 25 mcg to 75 mcg and follow up in two months.   Vitamin D level at deficient level at 17.2. Will prescribe 50,000 units weekly x 8 weeks. Would like to follow up in two months or so for repeat Vit. D level at the same time we get repeat TSH.   Will send result letter and call patient.   Ezequiel Essex, MD

## 2020-02-08 NOTE — Telephone Encounter (Signed)
Used the telephone interpeter service to call Alicia Kirby to discuss her lab results. Interpreter Alicia Kirby KM#[id number]. We discussed the following:  - A1c 6.2, using diet and exercise to control. Potentially stopping metformin at next visit.  - TSH high, needing to increase levothyroxine from 25 mcg to 75 mcg with two month f/u for recheck - Vit. D low, starting weekly supplement x 8 weeks, with 2 month f/u and recheck  Patient relayed she is scared and nervous about this news. When asked to explain further, she relays there is a lot of stress in her life. She isn't driving yet; she has a car but is waiting for the social security card to arrive in the mail before she can take her driver's test. She is stressed about the drivers test. She is also anxious about her granddaughter, there is some sort of situation where "she keeps getting pulled into cars by men, so I'm really cautious about that".   All questions answered. Scripts sent to pharmacy. Called pharmacy to ensure they could provide patient education materials and instructions in Spanish.   Ezequiel Essex, MD

## 2020-02-11 ENCOUNTER — Ambulatory Visit: Payer: 59

## 2020-02-23 ENCOUNTER — Ambulatory Visit (INDEPENDENT_AMBULATORY_CARE_PROVIDER_SITE_OTHER): Payer: 59 | Admitting: Family Medicine

## 2020-02-23 ENCOUNTER — Other Ambulatory Visit: Payer: Self-pay

## 2020-02-23 VITALS — BP 118/82 | HR 65

## 2020-02-23 DIAGNOSIS — J069 Acute upper respiratory infection, unspecified: Secondary | ICD-10-CM | POA: Diagnosis not present

## 2020-02-23 DIAGNOSIS — U071 COVID-19: Secondary | ICD-10-CM

## 2020-02-23 DIAGNOSIS — J029 Acute pharyngitis, unspecified: Secondary | ICD-10-CM | POA: Diagnosis not present

## 2020-02-23 HISTORY — DX: Acute upper respiratory infection, unspecified: J06.9

## 2020-02-23 LAB — SARS CORONAVIRUS 2 (TAT 6-24 HRS): SARS Coronavirus 2: POSITIVE — AB

## 2020-02-23 NOTE — Progress Notes (Signed)
    SUBJECTIVE:   CHIEF COMPLAINT / HPI:   Viral respiratory infection [name] notes that her granddaughter who lives at home was sick with a viral illness about 2 weeks ago which included fever, cough and congestion.  This past Thursday (5 days ago) [name] for started noticing headache, sore throat, cough.  She did have an elevated temperature up to 100.0 over the weekend.  This improved with Tylenol.  She is also been experiencing some muscle aches and pains.  She denies shortness of breath or chest pain.  Fully vaccinated against Covid (3 vaccines)  PERTINENT  PMH / PSH: Diabetes, history of thyroid cancer s/p thyroidectomy  OBJECTIVE:   BP 118/82   Pulse 65   SpO2 98%   General: Alert and cooperative and appears to be in no acute distress HEENT: Moist mucous membranes Cardio: Normal S1 and S2, no S3 or S4. Rhythm is regular. No murmurs or rubs.   Pulm: Clear to auscultation bilaterally, no crackles, wheezing, or diminished breath sounds. Normal respiratory effort Abdomen: Bowel sounds normal. Abdomen soft and non-tender.  Extremities: No peripheral edema. Warm/ well perfused.  Strong radial pulses. Neuro: Cranial nerves grossly intact  ASSESSMENT/PLAN:   Viral upper respiratory tract infection with cough History and physical exam are consistent with a viral respiratory tract infection.  No shortness of breath or hypoxia at this time.  No chest pain.  Covid or flu remain highest on the differential.  Stat Covid swab obtained today.  She may be eligible for additional therapies if we find a positive result by tomorrow.  She was encouraged to take Tylenol for fever or discomfort and stay well-hydrated.  Her risk factors for severe disease include diabetes, history of cancer.     Matilde Haymaker, MD [place]

## 2020-02-23 NOTE — Patient Instructions (Signed)
Based on your symptoms, I think it is very likely that you have Covid.  We are going to get test today.  Hopefully, we will have the results by tomorrow.  If you are positive, I will send an order to see if you qualify for additional therapy.  For now, I recommend you continue to use Tylenol for fever or discomfort and drink plenty of fluids.  Please call the clinic or come back in if you are experiencing significant shortness of breath or chest pain.  egn sus sntomas, creo que es muy probable que tenga Covid. Vamos a hacernos la prueba hoy. Con suerte, tendremos los resultados para [place]. Si es positivo, enviar una orden para ver si califica para terapia adicional. Por ahora, le recomiendo que siga usando Tylenol para la fiebre o las molestias y que beba mucho lquido. Llame a la clnica o regrese si experimenta dificultad para respirar significativa o dolor en el pecho.

## 2020-02-23 NOTE — Assessment & Plan Note (Signed)
History and physical exam are consistent with a viral respiratory tract infection.  No shortness of breath or hypoxia at this time.  No chest pain.  Covid or flu remain highest on the differential.  Stat Covid swab obtained today.  She may be eligible for additional therapies if we find a positive result by tomorrow.  She was encouraged to take Tylenol for fever or discomfort and stay well-hydrated.  Her risk factors for severe disease include diabetes, history of cancer.

## 2020-02-24 NOTE — Progress Notes (Signed)
[name] was called and informed of her positive Covid test.  She notes that she has not had any worsening symptoms or shortness of breath.  She is informed that she is safe and appropriate to continue convalescing at home.

## 2020-02-24 NOTE — Addendum Note (Signed)
Addended by: Grant Ruts on: 02/24/2020 12:28 PM   Modules accepted: Orders

## 2020-04-10 ENCOUNTER — Other Ambulatory Visit: Payer: Self-pay | Admitting: Family Medicine

## 2020-04-10 DIAGNOSIS — E559 Vitamin D deficiency, unspecified: Secondary | ICD-10-CM

## 2020-04-11 ENCOUNTER — Telehealth: Payer: Self-pay | Admitting: Family Medicine

## 2020-04-11 DIAGNOSIS — E890 Postprocedural hypothyroidism: Secondary | ICD-10-CM

## 2020-04-11 DIAGNOSIS — E559 Vitamin D deficiency, unspecified: Secondary | ICD-10-CM

## 2020-04-11 NOTE — Telephone Encounter (Signed)
Attempted to contact patient regarding her recent refill request for vitamin D. Used Child psychotherapist, Kristeen Miss [phone]. At this time, I decline to refill the vitamin D script. I would like her to come in to have labs drawn for vitamin D level and TSH.   She will coordinate with her daughter to provide a ride and will call the office one day in advance to let the lab know when she is coming.   Ezequiel Essex, MD

## 2020-04-14 NOTE — Patient Instructions (Signed)
Vrtigo Vertigo El vrtigo es la sensacin de que usted o las cosas que lo rodean se mueven cuando en realidad eso no sucede. Esta sensacin puede aparecer y desaparecer en cualquier momento. El vrtigo suele desaparecer solo. Esta afeccin puede ser peligrosa si ocurre cuando est realizando actividades como conducir o trabajar con mquinas. El mdico le har pruebas para encontrar la causa del vrtigo. Estas pruebas tambin ayudarn al mdico a decidir cul es el mejor tratamiento para usted. Siga estas indicaciones en su casa: Comida y bebida  Beba suficiente lquido para mantener el pis (la [place]) de color amarillo plido.  No beba alcohol.      Actividad  Retome sus actividades habituales como se lo haya indicado el mdico. Pregntele al mdico qu actividades son seguras para usted.  Por la maana, sintese primero a un lado de la cama. Cuando se sienta bien, pngase lentamente de pie mientras se sostiene de algo, hasta que sepa que ha logrado el equilibrio.  Muvase lentamente. Evite determinadas posiciones o hacer movimientos repentinos del cuerpo o de la cabeza, como se lo haya indicado el mdico.  Use un bastn si tiene dificultad para ponerse de pie o caminar.  Si se siente mareado, sintese de inmediato.  Evite realizar tareas o actividades que puedan causarle peligro a usted o a Producer, television/film/video si se marea.  Evite agacharse si se siente mareado. En su casa, coloque los objetos de modo que le resulte fcil alcanzarlos sin Office manager.  No conduzca vehculos ni opere maquinaria pesada si se siente mareado. Indicaciones generales  [employer] de venta libre y los recetados solamente como se lo haya indicado el mdico.  Consulting civil engineer a todas las visitas de control como se lo haya indicado el mdico. Esto es importante. Comunquese con un mdico si:  Los medicamentos no le [employer] vrtigo.  Tiene fiebre.  Los problemas empeoran o le aparecen sntomas  nuevos.  Sus familiares o amigos observan cambios en su comportamiento.  La sensacin de Air cabin crew.  Los vmitos empeoran.  Pierde la sensibilidad (tiene entumecimiento) en alguna parte del cuerpo.  Siente pinchazos u hormigueo en alguna parte del cuerpo. Solicite ayuda inmediatamente si:  Tiene dificultad para moverse o para caminar.  Esta mareado todo [employer].  Pierde el conocimiento (se desmaya).  Tiene dolores de [place] muy intensos.  Siente debilidad [employer], los brazos o las piernas.  Tiene cambios en la audicin.  Tiene cambios en la forma de ver (visin).  Tiene rigidez en el cuello.  La luz brillante empieza a molestarlo. Resumen  El vrtigo es la sensacin de que usted o las cosas que lo rodean se mueven cuando en realidad eso no sucede.  El mdico le har pruebas para encontrar la causa del vrtigo.  Tal vez le indiquen que evite algunas tareas, posiciones o movimientos.  Comunquese con un mdico si los medicamentos no lo ayudan o si tiene fiebre, sntomas nuevos o un cambio en el comportamiento.  Pida ayuda de inmediato si tiene dolores de cabeza muy intensos o si tiene cambios en la manera de hablar, or o ver. Esta informacin no tiene Marine scientist el consejo del mdico. Asegrese de hacerle al mdico cualquier pregunta que tenga. Document Revised: 02/03/2018 Document Reviewed: 02/03/2018 Elsevier Patient Education  [place].

## 2020-04-14 NOTE — Progress Notes (Signed)
    SUBJECTIVE:   Translated by Shanon Brow [phone]  CHIEF COMPLAINT / HPI:   Ear pain  Dizziness: Pain reports she started having pain to her right ear about 15 days ago.  She also reports that since then her hearing has also slightly decreased.  She denies any traumas to her ear or getting hit, denies drainage or bleeding, fevers, body aches, chills, nasal congestion, runny nose.  She denies any jaw pain or pain with chewing.   Dizziness: Patient reports that about 15 days ago she started having the sensation as if the room was occasionally spinning.  One of her triggers is when she is laying on one side and then rolls over to the other side.  Patient reports the room spins for about 1 minute and stops on its own; however, she continues to feel somewhat lightheaded after that.  She denies having any upper respiratory symptoms 15 days ago when the symptoms started.  Patient did have Covid in late January/early February.    PERTINENT  PMH / PSH:  Patient Active Problem List   Diagnosis Date Noted  . Vertigo, benign paroxysmal, right 04/15/2020  . Impacted cerumen of right ear 04/15/2020  . Viral upper respiratory tract infection with cough 02/23/2020  . Hypertension associated with diabetes ([place]) 02/05/2020  . Hypothyroid 02/05/2020  . Low vitamin B12 level 02/05/2020  . Panic attack 02/05/2020  . Hypercholesteremia 02/05/2020  . Depression, major, single episode, complete remission ([place]) 02/05/2020  . Diabetes mellitus without complication ([place])   . Healthcare maintenance   . Conflict between patient and family 01/06/2020     OBJECTIVE:   BP (!) 110/56   Pulse 70   Wt 154 lb 2 oz (69.9 kg)   SpO2 99%    Physical exam: General: Well-appearing patient, no apparent distress, nontoxic-appearing HEENT: Normocephalic, atraumatic, EOMI, PERRLA, left ear normal in appearance with normal TM; right ear canal with cerumen impaction.  After ear is cleaned out and cerumen removed TM is normal  in appearance without erythema, bulging, effusion. Respiratory: Comfortable work of breathing, speaking complete sentences Cardio: RRR, S1-S2 present, no murmurs appreciated Integumentary: No rashes appreciated Neuro: Cranial nerves II-XII grossly intact, no focal neurological deficits appreciated  ASSESSMENT/PLAN:   Impacted cerumen of right ear Patient with cerumen impaction of right ear.  Was able to be cleaned with irrigation and pain set was used to remove hard wax.  Patient reports that her hearing is now improved and she already feels less dizzy. -Instructed to not use Q-tips to clean the ears, told patient she could use Debrox over-the-counter as needed to clean out her ears at home safely  Vertigo, benign paroxysmal, right Patient with symptoms of vertigo.  Possible causes include patient's previous diagnosis of COVID and continual possible inflammation/labyrinthitis from that causing vertigo.  Patient also with right ear cerumen impaction at today's visit.  No concern for stroke or other neurological issue being associated with her dizziness; no focal neurological deficit appreciated on today's physical exam. -Cerumen impaction was cleaned out, patient tolerated well -Patient instructed she can continue to have dizziness for the next 2-4 weeks if it is due to labyrinthitis -We will continue to monitor for signs or symptoms -Strict return precautions given     Daisy Floro, [place]

## 2020-04-15 ENCOUNTER — Other Ambulatory Visit: Payer: Self-pay

## 2020-04-15 ENCOUNTER — Ambulatory Visit (INDEPENDENT_AMBULATORY_CARE_PROVIDER_SITE_OTHER): Payer: 59 | Admitting: Family Medicine

## 2020-04-15 ENCOUNTER — Other Ambulatory Visit: Payer: Self-pay | Admitting: Family Medicine

## 2020-04-15 VITALS — BP 110/56 | HR 70 | Wt 154.1 lb

## 2020-04-15 DIAGNOSIS — H8111 Benign paroxysmal vertigo, right ear: Secondary | ICD-10-CM | POA: Diagnosis not present

## 2020-04-15 DIAGNOSIS — E890 Postprocedural hypothyroidism: Secondary | ICD-10-CM

## 2020-04-15 DIAGNOSIS — H6121 Impacted cerumen, right ear: Secondary | ICD-10-CM

## 2020-04-15 HISTORY — DX: Impacted cerumen, right ear: H61.21

## 2020-04-15 NOTE — Assessment & Plan Note (Signed)
Patient with symptoms of vertigo.  Possible causes include patient's previous diagnosis of COVID and continual possible inflammation/labyrinthitis from that causing vertigo.  Patient also with right ear cerumen impaction at today's visit.  No concern for stroke or other neurological issue being associated with her dizziness; no focal neurological deficit appreciated on today's physical exam. -Cerumen impaction was cleaned out, patient tolerated well -Patient instructed she can continue to have dizziness for the next 2-4 weeks if it is due to labyrinthitis -We will continue to monitor for signs or symptoms -Strict return precautions given

## 2020-04-15 NOTE — Assessment & Plan Note (Signed)
Patient with cerumen impaction of right ear.  Was able to be cleaned with irrigation and pain set was used to remove hard wax.  Patient reports that her hearing is now improved and she already feels less dizzy. -Instructed to not use Q-tips to clean the ears, told patient she could use Debrox over-the-counter as needed to clean out her ears at home safely

## 2020-04-22 ENCOUNTER — Other Ambulatory Visit: Payer: 59

## 2020-04-22 ENCOUNTER — Other Ambulatory Visit: Payer: Self-pay

## 2020-04-22 DIAGNOSIS — E890 Postprocedural hypothyroidism: Secondary | ICD-10-CM

## 2020-04-22 DIAGNOSIS — E559 Vitamin D deficiency, unspecified: Secondary | ICD-10-CM

## 2020-04-23 LAB — TSH: TSH: 1.2 u[IU]/mL (ref 0.450–4.500)

## 2020-04-23 LAB — VITAMIN D 25 HYDROXY (VIT D DEFICIENCY, FRACTURES): Vit D, 25-Hydroxy: 33.4 ng/mL (ref 30.0–100.0)

## 2020-04-27 ENCOUNTER — Other Ambulatory Visit: Payer: Self-pay | Admitting: Family Medicine

## 2020-04-27 ENCOUNTER — Telehealth: Payer: Self-pay | Admitting: Family Medicine

## 2020-04-27 ENCOUNTER — Encounter: Payer: Self-pay | Admitting: Family Medicine

## 2020-04-27 DIAGNOSIS — E890 Postprocedural hypothyroidism: Secondary | ICD-10-CM

## 2020-04-27 MED ORDER — LEVOTHYROXINE SODIUM 75 MCG PO TABS: 75.0000 ug | ORAL_TABLET | ORAL | 0 refills | Status: DC

## 2020-04-27 NOTE — Telephone Encounter (Signed)
Used Spanish interpreter, name [name], ID# [id number]. Called regarding her recent lab work. No answer, no option for voicemail. Will try again later.   Will send letter with the following information: Overall, Vitamin D improved. No indication to continue Vitamin D supplementation at this time. TSH normal at 1.200, much improved from 2 months ago. Will continue with the current dose of levothyroxine at 75 mcg daily. Will send a new script.   Ezequiel Essex, MD

## 2020-04-29 ENCOUNTER — Inpatient Hospital Stay: Admission: RE | Admit: 2020-04-29 | Payer: 59 | Source: Ambulatory Visit

## 2020-05-11 ENCOUNTER — Other Ambulatory Visit: Payer: Self-pay | Admitting: *Deleted

## 2020-05-11 DIAGNOSIS — E890 Postprocedural hypothyroidism: Secondary | ICD-10-CM

## 2020-05-11 DIAGNOSIS — E559 Vitamin D deficiency, unspecified: Secondary | ICD-10-CM

## 2020-05-11 MED ORDER — LEVOTHYROXINE SODIUM 75 MCG PO TABS: 75.0000 ug | ORAL_TABLET | ORAL | 0 refills | Status: DC

## 2020-05-11 MED ORDER — METFORMIN HCL 500 MG PO TABS: 500.0000 mg | ORAL_TABLET | Freq: Two times a day (BID) | ORAL | 1 refills | Status: DC

## 2020-05-11 MED ORDER — LISINOPRIL 40 MG PO TABS: 40.0000 mg | ORAL_TABLET | Freq: Every day | ORAL | 1 refills | Status: DC

## 2020-05-11 MED ORDER — CHOLECALCIFEROL 1.25 MG (50000 UT) PO TABS: 50000.0000 [IU] | ORAL_TABLET | ORAL | 0 refills | Status: DC

## 2020-05-11 MED ORDER — AMLODIPINE BESYLATE 2.5 MG PO TABS
2.5000 mg | ORAL_TABLET | Freq: Every day | ORAL | 1 refills | Status: DC
Start: 2020-05-11 — End: 2020-07-05

## 2020-05-11 NOTE — Telephone Encounter (Signed)
Daughter called and states that medication for thyroid was never received by pharmacy.  She said that patient also needs her bp and diabetes meds refilled.  Will forward to MD.  I did make a follow up appt for patient on 05-27-2020.  Saleem Coccia,CMA

## 2020-05-22 ENCOUNTER — Encounter: Payer: Self-pay | Admitting: Family Medicine

## 2020-05-22 NOTE — Progress Notes (Deleted)
    SUBJECTIVE:   CHIEF COMPLAINT / HPI:   Diabetes  - Current regimen includes metformin 500 mg twice daily -Last A1c 5.8 in January 2022 -A1c today***  HTN -Current regimen includes amlodipine 2.5 daily, hydrochlorothiazide 20 mg daily, lisinopril 40 mg daily - BP today ***  Low vitamin D - level 02/09/2020 low at 17.2, started on cholecalciferol 50,000 units weekly - improved to 33.4 on 04/22/2020 - will discuss discontinuing Vit. D supplementation   PERTINENT  PMH / PSH: T2DM, HTN, hypothyroidism (surgical), benign paroxysmal vertigo, hypercholesterolemia, depression  OBJECTIVE:   There were no vitals taken for this visit.  ***  ASSESSMENT/PLAN:   No problem-specific Assessment & Plan notes found for this encounter.     Ezequiel Essex, MD [place]   {    This will disappear when note is signed, click to select method of visit    :1}

## 2020-05-22 NOTE — Patient Instructions (Incomplete)
It was wonderful to see you today. Thank you for allowing me to be a part of your care. Below is a short summary of what we discussed at your visit today: Fue maravilloso verte hoy. Gracias por permitirme ser parte de su cuidado. A continuacin se muestra un breve resumen de lo que discutimos en su visita de hoy:  Diabetes -Your last A1c in January was 5.8 (actually in the prediabetic range) -Su ltima A1c en enero fue 5.8 (en realidad en el rango prediabtico) - We will not need to check your A1c again until June, 6 months after the previous check - No necesitaremos volver a controlar su A1c hasta junio, 6 meses despus del control anterior - Since you are tolerating your metformin very well, we will continue this with no changes - Dado que est tolerando muy bien su metformina, continuaremos con esto sin cambios  Hypertension  Hipertensin - You are currently on amlodipine, hydrochlorothiazide, and lisinopril - Actualmente est tomando amlodipina, hidroclorotiazida y lisinopril - Blood pressure today was *** - We will not need to make any changes to your medications at this time.  Keep doing a great job. - No necesitaremos hacer ningn cambio a sus medicamentos en este momento. Sigue haciendo un gran trabajo.  Low vitamin D levels  Niveles bajos de vitamina D - You have been on vitamin D weekly since January because of a low vitamin D level measured in January - [place] D semanalmente desde enero debido a un nivel bajo de vitamina D medido en enero - Your vitamin D level improved greatly while we checked again in March - Su nivel de vitamina D mejor mucho mientras lo revisamos nuevamente en [employer] - Once you run out of the vitamin D in the current pill bottle, you do not need to take anymore - Una vez que se le acabe la vitamina D en el frasco de pldoras actual, no necesita tomar ms   Please bring all of your medications to every appointment! Por favor traiga  todos sus medicamentos a cada cita!  If you have any questions or concerns, please do not hesitate to contact us via phone or MyChart message.  Si tiene alguna pregunta o inquietud, no dude en comunicarse con nosotros por telfono o mensaje de MyChart.  Ezequiel Essex, MD

## 2020-05-27 ENCOUNTER — Ambulatory Visit: Payer: 59 | Admitting: Family Medicine

## 2020-06-08 ENCOUNTER — Ambulatory Visit (INDEPENDENT_AMBULATORY_CARE_PROVIDER_SITE_OTHER): Payer: 59 | Admitting: Family Medicine

## 2020-06-08 ENCOUNTER — Encounter: Payer: Self-pay | Admitting: Family Medicine

## 2020-06-08 ENCOUNTER — Other Ambulatory Visit: Payer: Self-pay

## 2020-06-08 VITALS — BP 152/45 | HR 73 | Ht 63.0 in | Wt 159.4 lb

## 2020-06-08 DIAGNOSIS — Z79899 Other long term (current) drug therapy: Secondary | ICD-10-CM | POA: Diagnosis not present

## 2020-06-08 DIAGNOSIS — R635 Abnormal weight gain: Secondary | ICD-10-CM | POA: Diagnosis not present

## 2020-06-08 DIAGNOSIS — E1159 Type 2 diabetes mellitus with other circulatory complications: Secondary | ICD-10-CM | POA: Diagnosis not present

## 2020-06-08 DIAGNOSIS — E119 Type 2 diabetes mellitus without complications: Secondary | ICD-10-CM

## 2020-06-08 DIAGNOSIS — I152 Hypertension secondary to endocrine disorders: Secondary | ICD-10-CM

## 2020-06-08 LAB — POCT GLYCOSYLATED HEMOGLOBIN (HGB A1C): HbA1c, POC (controlled diabetic range): 6.2 % (ref 0.0–7.0)

## 2020-06-08 NOTE — Patient Instructions (Signed)
It was wonderful to see you today. Thank you for allowing me to be a part of your care. Below is a short summary of what we discussed at your visit today:  Blood pressure  Presin arterial - Your top number is a little high but the bottom number is a little low - I will set you up to have a 24 hour blood pressure study done in our clinic with Dr. Valentina Lucks - No changes to your blood pressure medications today\ - Su nmero superior es un poco alto pero el nmero inferior es un poco bajo - Le programar un estudio de presin arterial de 24 horas en nuestra clnica con el Dr. Valentina Lucks - No hay cambios en sus medicamentos para la presin arterial hoy.  Thyroid - Your thyroid lab was normal - Keep taking your thyroid medicine daily Tiroides - Su laboratorio de tiroides fue normal - Siga tomando su medicamento para la tiroides diariamente  [employer] loss - Focus on nutrient rich foods that fill you up like fruits, vegetables, and lean meats - Walk for 1-2 miles each day Prdida de peso - Concntrese en alimentos ricos en nutrientes que lo llenen como frutas, verduras y carnes magras - [place] de [address] a [place]  Please bring all of your medications to every appointment!  If you have any questions or concerns, please do not hesitate to contact us via phone or MyChart message.   Ezequiel Essex, MD

## 2020-06-09 DIAGNOSIS — R635 Abnormal weight gain: Secondary | ICD-10-CM | POA: Insufficient documentation

## 2020-06-09 MED ORDER — METFORMIN HCL 500 MG PO TABS: 500.0000 mg | ORAL_TABLET | Freq: Every day | ORAL | 1 refills | Status: DC

## 2020-06-09 NOTE — Progress Notes (Signed)
    SUBJECTIVE:   CHIEF COMPLAINT / HPI:   Diabetes follow up - currently on metformin 500 daily - tolerating well - no complaints - last A1c 5.8 in January  Hypertension follow up - currently on amlodipine 2.5, lisinopril 40 mg, HCTZ 25 - no episodes of hypotension - no dizziness, syncope  Anxiety and desire to eat - concerned about her constant desire to eat - links it to anxiety - not hungry so much as cravings - worried about gaining weight, clothes fitting tighter  PERTINENT  PMH / PSH: HTN, T2DM, HLD, benign paroxysmal vertigo  OBJECTIVE:   BP (!) 152/45   Pulse 73   Ht 5\' 3"  (1.6 m)   Wt 72.3 kg   SpO2 100%   BMI 28.24 kg/m    PHQ-9:  Depression screen [facility] 2/9 04/15/2020 02/05/2020  Decreased Interest 1 0  Down, Depressed, Hopeless 1 0  PHQ - 2 Score 2 0  Altered sleeping 0 0  Tired, decreased energy 0 0  Change in appetite 1 1  Feeling bad or failure about yourself  1 0  Trouble concentrating 0 0  Moving slowly or fidgety/restless 0 0  Suicidal thoughts 0 0  PHQ-9 Score 4 1  Difficult doing work/chores - Not difficult at all     GAD-7: No flowsheet data found.   Physical Exam General: Awake, alert, oriented Cardiovascular: Regular rate and rhythm, S1 and S2 present, no murmurs auscultated Respiratory: Lung fields clear to auscultation bilaterally   ASSESSMENT/PLAN:   Hypertension associated with diabetes (HCC) BP today 152/45. Denies hypotensive episodes, dizziness, or syncope. Currently on amlodipine 2.5, HCTZ 25, and lisinopril 40.  - Dr. Valentina Lucks 24 hour BP study - likely reduce HCTZ at next visit - next visit, obtain BMP to check K  Diabetes mellitus without complication ([place]) G8Z today 6.2, up marginally from 5.8 in January. During med rec today, pt reported taking 500 mg metformin daily, not BID.  - continue metformin 500 daily - next recheck 6 months - consider DC metformin if patient amenable  Unintended weight gain Patient concerned  about weight gain and "desire to eat all the time". Discussed choosing healthy food that promotes satiety such as fruits and vegetables. Also discussed walking a mile or two every day. Will continue to follow.      Ezequiel Essex, MD [place]

## 2020-06-09 NOTE — Assessment & Plan Note (Signed)
A1c today 6.2, up marginally from 5.8 in January. During med rec today, pt reported taking 500 mg metformin daily, not BID.  - continue metformin 500 daily - next recheck 6 months - consider DC metformin if patient amenable

## 2020-06-09 NOTE — Assessment & Plan Note (Signed)
BP today 152/45. Denies hypotensive episodes, dizziness, or syncope. Currently on amlodipine 2.5, HCTZ 25, and lisinopril 40.  - Dr. Valentina Lucks 24 hour BP study - likely reduce HCTZ at next visit - next visit, obtain BMP to check K

## 2020-06-09 NOTE — Assessment & Plan Note (Signed)
Patient concerned about weight gain and "desire to eat all the time". Discussed choosing healthy food that promotes satiety such as fruits and vegetables. Also discussed walking a mile or two every day. Will continue to follow.

## 2020-06-16 ENCOUNTER — Other Ambulatory Visit: Payer: Self-pay | Admitting: Family Medicine

## 2020-06-16 DIAGNOSIS — Z780 Asymptomatic menopausal state: Secondary | ICD-10-CM

## 2020-06-16 DIAGNOSIS — E559 Vitamin D deficiency, unspecified: Secondary | ICD-10-CM

## 2020-06-16 DIAGNOSIS — E2839 Other primary ovarian failure: Secondary | ICD-10-CM

## 2020-06-21 ENCOUNTER — Other Ambulatory Visit: Payer: Self-pay

## 2020-06-21 ENCOUNTER — Encounter: Payer: Self-pay | Admitting: Family Medicine

## 2020-06-21 ENCOUNTER — Ambulatory Visit
Admission: RE | Admit: 2020-06-21 | Discharge: 2020-06-21 | Disposition: A | Discharge status: Home / Self Care | Payer: 59 | Source: Ambulatory Visit | Attending: Family Medicine | Admitting: Family Medicine

## 2020-06-21 DIAGNOSIS — Z780 Asymptomatic menopausal state: Secondary | ICD-10-CM

## 2020-06-21 DIAGNOSIS — E559 Vitamin D deficiency, unspecified: Secondary | ICD-10-CM

## 2020-06-21 DIAGNOSIS — E2839 Other primary ovarian failure: Secondary | ICD-10-CM

## 2020-06-21 NOTE — Progress Notes (Signed)
Results letter for DEXA scan.   Alicia Essex, MD

## 2020-06-22 ENCOUNTER — Ambulatory Visit
Admission: RE | Admit: 2020-06-22 | Discharge: 2020-06-22 | Disposition: A | Discharge status: Home / Self Care | Payer: 59 | Source: Ambulatory Visit | Attending: Family Medicine | Admitting: Family Medicine

## 2020-06-22 DIAGNOSIS — Z0000 Encounter for general adult medical examination without abnormal findings: Secondary | ICD-10-CM

## 2020-06-22 DIAGNOSIS — Z1231 Encounter for screening mammogram for malignant neoplasm of breast: Secondary | ICD-10-CM

## 2020-06-24 ENCOUNTER — Encounter: Payer: Self-pay | Admitting: Family Medicine

## 2020-06-24 ENCOUNTER — Other Ambulatory Visit: Payer: Self-pay | Admitting: Family Medicine

## 2020-06-24 NOTE — Progress Notes (Signed)
Results letter for mammogram  Ezequiel Essex, MD

## 2020-07-05 ENCOUNTER — Other Ambulatory Visit: Payer: Self-pay | Admitting: Family Medicine

## 2020-07-05 DIAGNOSIS — E559 Vitamin D deficiency, unspecified: Secondary | ICD-10-CM

## 2020-07-05 DIAGNOSIS — E890 Postprocedural hypothyroidism: Secondary | ICD-10-CM

## 2020-07-05 DIAGNOSIS — E119 Type 2 diabetes mellitus without complications: Secondary | ICD-10-CM

## 2020-07-05 MED ORDER — LEVOTHYROXINE SODIUM 75 MCG PO TABS: 75.0000 ug | ORAL_TABLET | ORAL | 1 refills | Status: DC

## 2020-07-05 MED ORDER — VITAMIN B-12 1000 MCG PO TABS: 1000.0000 ug | ORAL_TABLET | Freq: Every day | ORAL | 0 refills | Status: DC

## 2020-07-05 MED ORDER — HYDROCHLOROTHIAZIDE 25 MG PO TABS: 25.0000 mg | ORAL_TABLET | Freq: Every day | ORAL | 3 refills | Status: DC

## 2020-07-05 MED ORDER — AMLODIPINE BESYLATE 2.5 MG PO TABS: 2.5000 mg | ORAL_TABLET | Freq: Every day | ORAL | 3 refills | Status: DC

## 2020-07-05 NOTE — Telephone Encounter (Signed)
Daughter called stating that patient needs refills on all of her medications.  Will forward to MD. Johnney Ou

## 2020-07-05 NOTE — Progress Notes (Signed)
DC cholecalciferol from med list. Per phone note from 3/30, vitamin D level improved and she no longer needs vitamin D supplementation.   Will ask [facility] admin to call and get her scheduled for ambulatory BP monitoring with Dr. Valentina Lucks. This was mentioned back on 5/11 but not set up. Would like to have this done to see if any BP med adjustments indicated.   For now, will give 90 day refills on appropriate medications. However, will give 30 day refills on BP meds in case I need to adjust later.    Ezequiel Essex, MD

## 2020-08-28 ENCOUNTER — Other Ambulatory Visit: Payer: Self-pay | Admitting: Family Medicine

## 2020-12-29 ENCOUNTER — Other Ambulatory Visit: Payer: Self-pay

## 2020-12-29 ENCOUNTER — Ambulatory Visit (INDEPENDENT_AMBULATORY_CARE_PROVIDER_SITE_OTHER): Payer: 59 | Admitting: Family Medicine

## 2020-12-29 VITALS — BP 120/70 | HR 68 | Wt 146.2 lb

## 2020-12-29 DIAGNOSIS — H6121 Impacted cerumen, right ear: Secondary | ICD-10-CM

## 2020-12-29 DIAGNOSIS — Z23 Encounter for immunization: Secondary | ICD-10-CM

## 2020-12-29 NOTE — Patient Instructions (Signed)
It was nice seeing you today!  If you develop further issues with earwax, I recommend Debrox twice a day for up to 4 days as needed.  Stay well, [name], MD [place] [phone]

## 2020-12-29 NOTE — Progress Notes (Signed)
    SUBJECTIVE:   CHIEF COMPLAINT / HPI: ear pain  Spanish video interpreter present  Patient reports persistent right ear pain that has been ongoing but worsened in the past 2 weeks.  Denies any hearing loss or ear drainage.  No fever, cough, sore throat, rhinorrhea.  No known water exposure.  Reports ears were cleaned 1 year ago.  PERTINENT  PMH / PSH: vertigo, depression, T2DM, HTN  OBJECTIVE:   BP 120/70   Pulse 68   Wt 146 lb 4 oz (66.3 kg)   SpO2 99%   BMI 25.91 kg/m   General: elderly female, NAD HEENT: Bilateral TMs appear clear.  There is a large amount of cerumen on the right ear which is not occluding the TM.  ASSESSMENT/PLAN:   Cerumen debris on tympanic membrane of right ear Believe ear pain is secondary to cerumen.  No evidence of infection.  Improved s/p irrigation still a small amount of cerumen remaining.  Advised she can use Debrox as needed if pain persists.  Advised against Q-tips.  Need for immunization against influenza - Plan: Flu Vaccine QUAD High Dose(Fluad)      [name], MD [place]

## 2021-03-27 ENCOUNTER — Other Ambulatory Visit: Payer: Self-pay

## 2021-03-27 DIAGNOSIS — E890 Postprocedural hypothyroidism: Secondary | ICD-10-CM

## 2021-03-27 MED ORDER — LEVOTHYROXINE SODIUM 75 MCG PO TABS: 75.0000 ug | ORAL_TABLET | ORAL | 1 refills | Status: DC

## 2021-04-04 ENCOUNTER — Ambulatory Visit (INDEPENDENT_AMBULATORY_CARE_PROVIDER_SITE_OTHER): Payer: 59

## 2021-04-04 ENCOUNTER — Encounter: Payer: Self-pay | Admitting: Family Medicine

## 2021-04-04 ENCOUNTER — Ambulatory Visit (INDEPENDENT_AMBULATORY_CARE_PROVIDER_SITE_OTHER): Payer: 59 | Admitting: Family Medicine

## 2021-04-04 ENCOUNTER — Other Ambulatory Visit: Payer: Self-pay

## 2021-04-04 VITALS — BP 140/55 | HR 72 | Ht 63.0 in | Wt 157.6 lb

## 2021-04-04 DIAGNOSIS — Z1211 Encounter for screening for malignant neoplasm of colon: Secondary | ICD-10-CM | POA: Diagnosis not present

## 2021-04-04 DIAGNOSIS — E1159 Type 2 diabetes mellitus with other circulatory complications: Secondary | ICD-10-CM

## 2021-04-04 DIAGNOSIS — E119 Type 2 diabetes mellitus without complications: Secondary | ICD-10-CM | POA: Diagnosis not present

## 2021-04-04 DIAGNOSIS — Z23 Encounter for immunization: Secondary | ICD-10-CM

## 2021-04-04 DIAGNOSIS — I152 Hypertension secondary to endocrine disorders: Secondary | ICD-10-CM

## 2021-04-04 DIAGNOSIS — E039 Hypothyroidism, unspecified: Secondary | ICD-10-CM | POA: Diagnosis not present

## 2021-04-04 LAB — POCT GLYCOSYLATED HEMOGLOBIN (HGB A1C): HbA1c, POC (controlled diabetic range): 6.3 % (ref 0.0–7.0)

## 2021-04-04 MED ORDER — AMLODIPINE BESYLATE 2.5 MG PO TABS: 2.5000 mg | ORAL_TABLET | Freq: Every day | ORAL | 0 refills | Status: DC

## 2021-04-04 MED ORDER — HYDROCHLOROTHIAZIDE 25 MG PO TABS: 25.0000 mg | ORAL_TABLET | Freq: Every day | ORAL | 1 refills | Status: DC

## 2021-04-04 MED ORDER — LISINOPRIL 40 MG PO TABS: 40.0000 mg | ORAL_TABLET | Freq: Every day | ORAL | 1 refills | Status: DC

## 2021-04-04 NOTE — Progress Notes (Signed)
? ?  [place] video interpreter [id number] ? ?SUBJECTIVE:  ? ?CHIEF COMPLAINT / HPI:  ? ?Hypothyroidism: She is compliant with Synthroid 75 mcg qd. She is here for lab follow-up. ? ?HTN:Out of Norvasc 2.5 mg daily since two days ago. She Med refills needed for all meds. She is compliant with Norvasc, HCTZ 25 QD and Lisinopril 40 mg QD. No concerns about her BP. ? ?DM2:Compliant with Metformin 500 mg QD. She is here for f/u. ? ? ?PERTINENT  PMH / PSH: PMHx reviewed ? ?OBJECTIVE:  ? ?BP (!) 140/55   Pulse 72   Ht '5\' 3"'$  (1.6 m)   Wt 157 lb 9.6 oz (71.5 kg)   SpO2 100%   BMI 27.92 kg/m?   ?Physical Exam ?Vitals and nursing note reviewed.  ?Constitutional:   ?   Appearance: Normal appearance.  ?Cardiovascular:  ?   Rate and Rhythm: Normal rate and regular rhythm.  ?   Heart sounds: Normal heart sounds. No murmur heard. ?Pulmonary:  ?   Effort: Pulmonary effort is normal. No respiratory distress.  ?   Breath sounds: Normal breath sounds. No wheezing.  ?Abdominal:  ?   General: Bowel sounds are normal.  ?   Tenderness: There is no abdominal tenderness.  ?Musculoskeletal:  ?   Right lower leg: No edema.  ?   Left lower leg: No edema.  ? ? ? ?ASSESSMENT/PLAN:  ? ?Hypothyroid ?TSH checked. ?I will contact her with results and adjust/refill meds as needed per her result. ?She agreed with the plan.  ? ?Hypertension associated with diabetes ([place]) ?BP looks good despite been off Norvasc for 2 days. ?Norvasc half life up to 50 hrs. Hence, she might be having residual benefit from this medication. ?All antihypertensive refilled. ?I advised her to return in 2-4 weeks for BP check with her PCP. ?May adjust meds as needed at the visit. ?She agreed with the plan.  ? ?Diabetes mellitus without complication ([place]) ?A1C checked today looks good. ?Continue Metformin 500 mg QD. ?Bmet abd FLP checked. ?Will contact her with her results.  ?  ?HM: PCV20 and COVID booster shots were given today. ? ?Andrena Mews, MD ?[place]  ? ?

## 2021-04-04 NOTE — Assessment & Plan Note (Addendum)
A1C checked today looks good. ?Continue Metformin 500 mg QD. ?Bmet abd FLP checked. ?Will contact her with her results.  ?

## 2021-04-04 NOTE — Patient Instructions (Addendum)
Fue agradable verte hoy. Su prueba de diabetes se ve bien. Por lo tanto, contin?e con metformina 500 mg al d?a. ?Su PA se ve bien, con PA sist?lica ligeramente elevada. Rellen? todos sus medicamentos. Por favor, consulte a su PCP en 2-3 semanas para un control de la presi?n arterial, ella podr?a lograr que deje de tomar uno de sus medicamentos. ?Te llamar? pronto con tu prueba de tiroides. ? ?It was nice seeing you today. Your diabetes test looks good. Hence, continue Metformin 500 mg daily. ?You BP looks ok, with slightly elevated systolic BP. I refilled all your medications. Please, see your PCP in 2-3 weeks for BP check, she might be able to get you off one of your medications.  ?I will call you soon with your thyroid test. ?

## 2021-04-04 NOTE — Assessment & Plan Note (Signed)
BP looks good despite been off Norvasc for 2 days. ?Norvasc half life up to 50 hrs. Hence, she might be having residual benefit from this medication. ?All antihypertensive refilled. ?I advised her to return in 2-4 weeks for BP check with her PCP. ?May adjust meds as needed at the visit. ?She agreed with the plan.  ?

## 2021-04-04 NOTE — Assessment & Plan Note (Signed)
TSH checked. ?I will contact her with results and adjust/refill meds as needed per her result. ?She agreed with the plan.  ?

## 2021-04-05 ENCOUNTER — Encounter: Payer: Self-pay | Admitting: Family Medicine

## 2021-04-05 ENCOUNTER — Telehealth: Payer: Self-pay | Admitting: Family Medicine

## 2021-04-05 DIAGNOSIS — E890 Postprocedural hypothyroidism: Secondary | ICD-10-CM

## 2021-04-05 LAB — BASIC METABOLIC PANEL
BUN/Creatinine Ratio: 23 (ref 12–28)
BUN: 19 mg/dL (ref 8–27)
CO2: 25 mmol/L (ref 20–29)
Calcium: 10 mg/dL (ref 8.7–10.3)
Chloride: 98 mmol/L (ref 96–106)
Creatinine, Ser: 0.81 mg/dL (ref 0.57–1.00)
Glucose: 140 mg/dL — ABNORMAL HIGH (ref 70–99)
Potassium: 4.9 mmol/L (ref 3.5–5.2)
Sodium: 139 mmol/L (ref 134–144)
eGFR: 77 mL/min/{1.73_m2} (ref >59)

## 2021-04-05 LAB — LIPID PANEL
Chol/HDL Ratio: 4.1 ratio (ref 0.0–4.4)
Cholesterol, Total: 248 mg/dL — ABNORMAL HIGH (ref 100–199)
HDL: 61 mg/dL (ref >39)
LDL Chol Calc (NIH): 168 mg/dL — ABNORMAL HIGH (ref 0–99)
Triglycerides: 106 mg/dL (ref 0–149)
VLDL Cholesterol Cal: 19 mg/dL (ref 5–40)

## 2021-04-05 LAB — TSH: TSH: 3.33 u[IU]/mL (ref 0.450–4.500)

## 2021-04-05 MED ORDER — LEVOTHYROXINE SODIUM 75 MCG PO TABS: 75.0000 ug | ORAL_TABLET | ORAL | 1 refills | Status: DC

## 2021-04-05 NOTE — Progress Notes (Signed)
Your Thyroid test is normal. Continue the current dose of Synthroid. Refills were sent to her pharmacy. ? ?The cholesterol test is elevated. You need to be on Cholesterol medicine. Let me know if I should go ahead with refills. ? ?Please, remember to schedule PCP f/u in 2-4 weeks for BP management as discussed at her appointment yesterday. ? ?Su prueba de tiroides es normal. Contin?e con la dosis actual de Synthroid. Se enviaron recargas a su farmacia. ? ?La prueba de colesterol est? elevada. Debe estar tomando medicamentos para el colesterol. Av?seme si debo seguir adelante con las recargas. ? ?Por favor, recuerde programar PCP f/u en 2 a 4 semanas para el control de la presi?n arterial como se discuti? en su cita ayer. ? ? ?

## 2021-04-05 NOTE — Telephone Encounter (Signed)
?  [place] interpreter [id number] ? ?I called all numbers listed in Epic multiple times with no response. ?HIPAA-compliant messages were left with an interpreter and later without. ? ?Please advise her that her Thyroid test is normal. Continue the current dose of Synthroid. Refills were sent to her pharmacy. ? ?The cholesterol test is elevated. She needs to be on Cholesterol medicine. Let me know if I should go ahead with refills. ? ?Remind her to schedule PCP f/u in 2-4 weeks for BP management as discussed at her appointment yesterday. ? ?I will also send the result letter to her home. ?

## 2021-06-14 ENCOUNTER — Other Ambulatory Visit: Payer: Self-pay | Admitting: Family Medicine

## 2021-06-14 DIAGNOSIS — E119 Type 2 diabetes mellitus without complications: Secondary | ICD-10-CM

## 2021-07-10 IMAGING — MG DIGITAL SCREENING BILAT W/ CAD
4 series · 4 of 4 positions shown · non-contrast
Comparison: None.

CLINICAL DATA: Screening.

EXAM:
DIGITAL SCREENING BILATERAL MAMMOGRAM WITH CAD
TECHNIQUE: Bilateral screening digital craniocaudal and mediolateral oblique
mammograms were obtained. The images were evaluated with
computer-aided detection.

[R MLO]
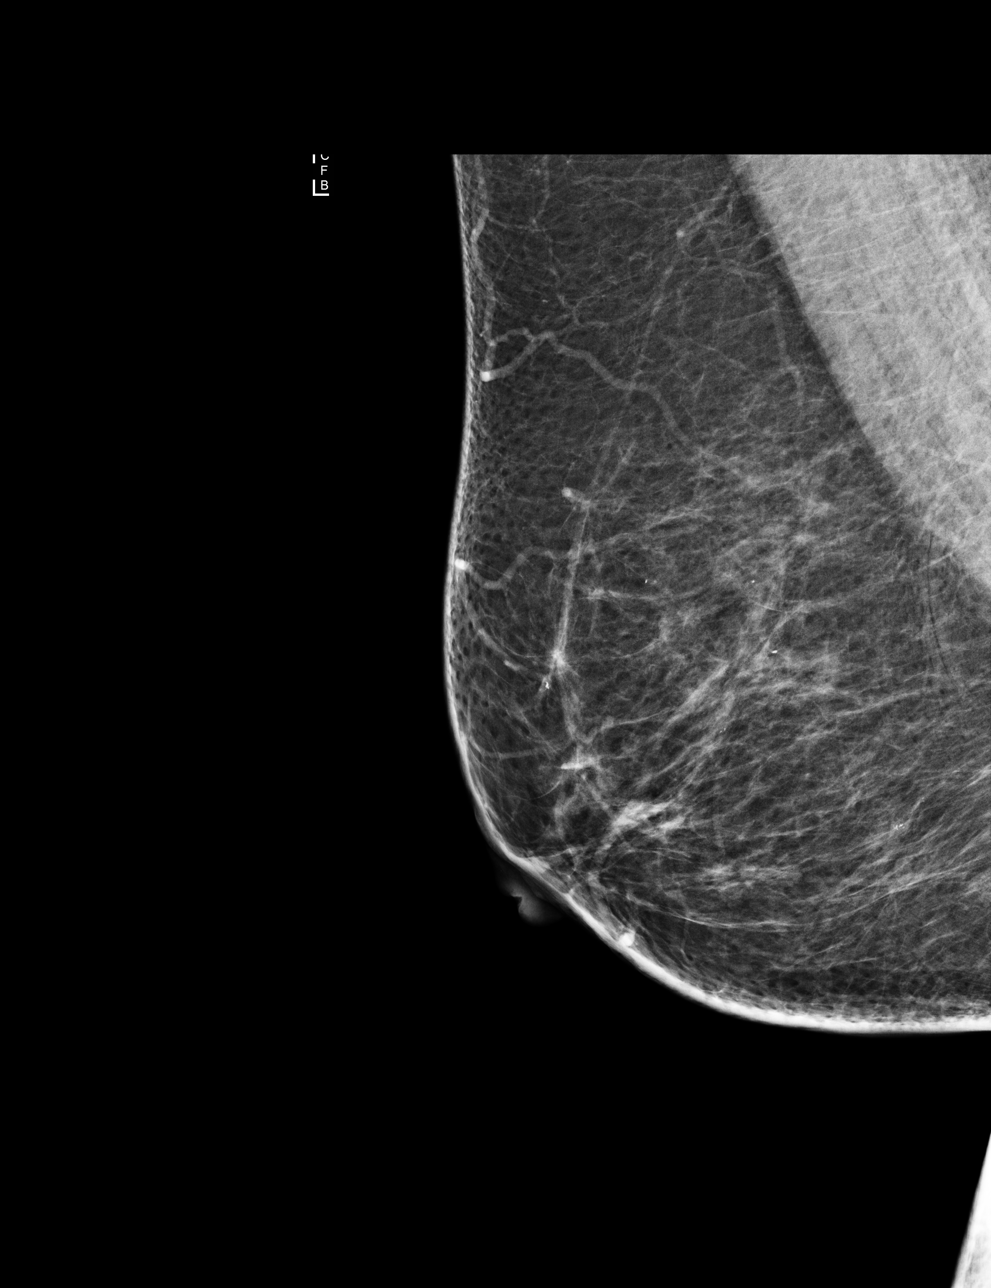

[L CC]
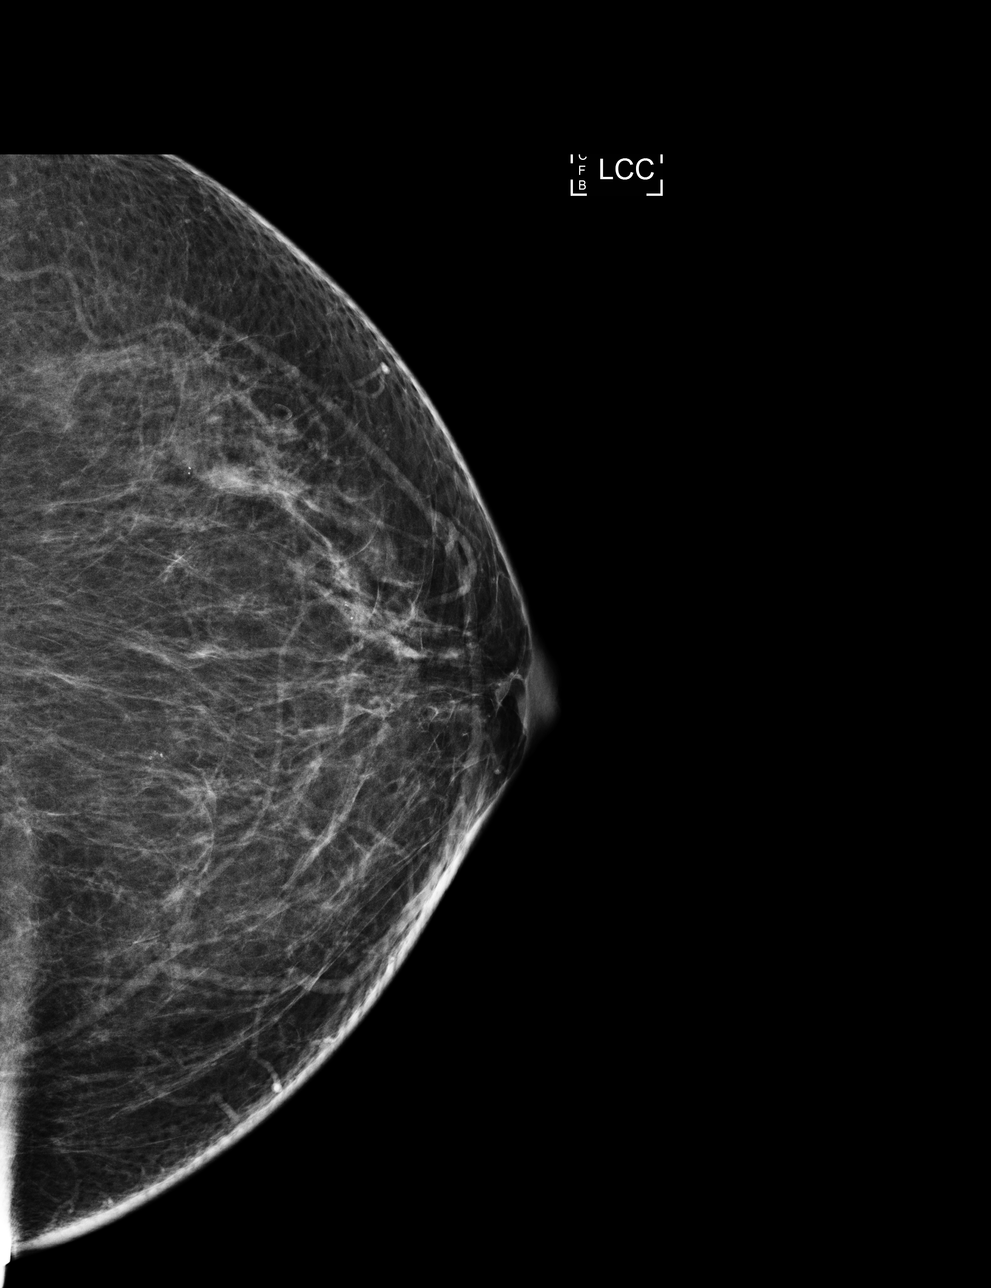

[L MLO]
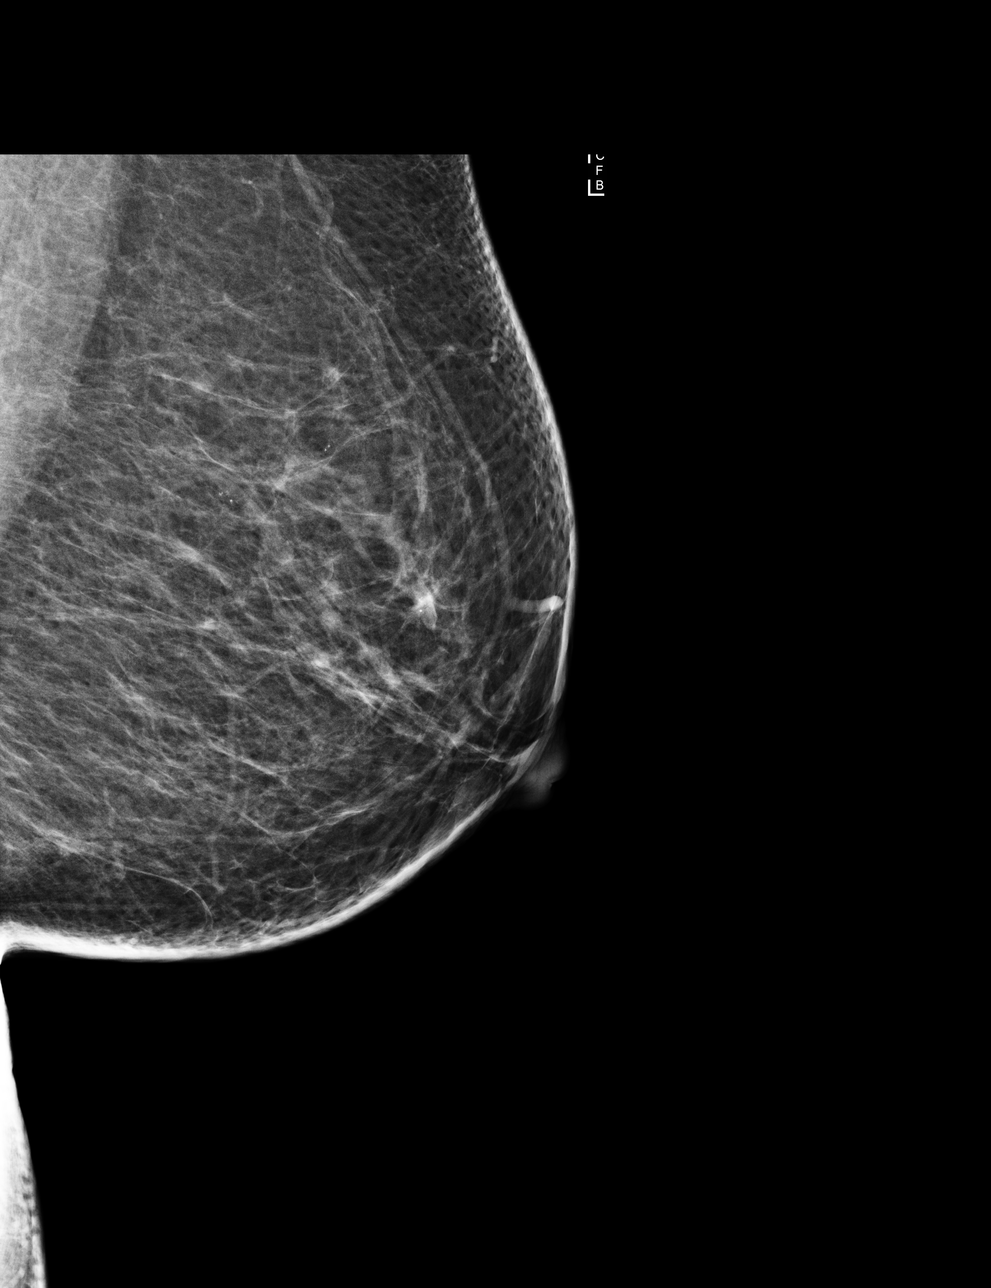

[R CC]
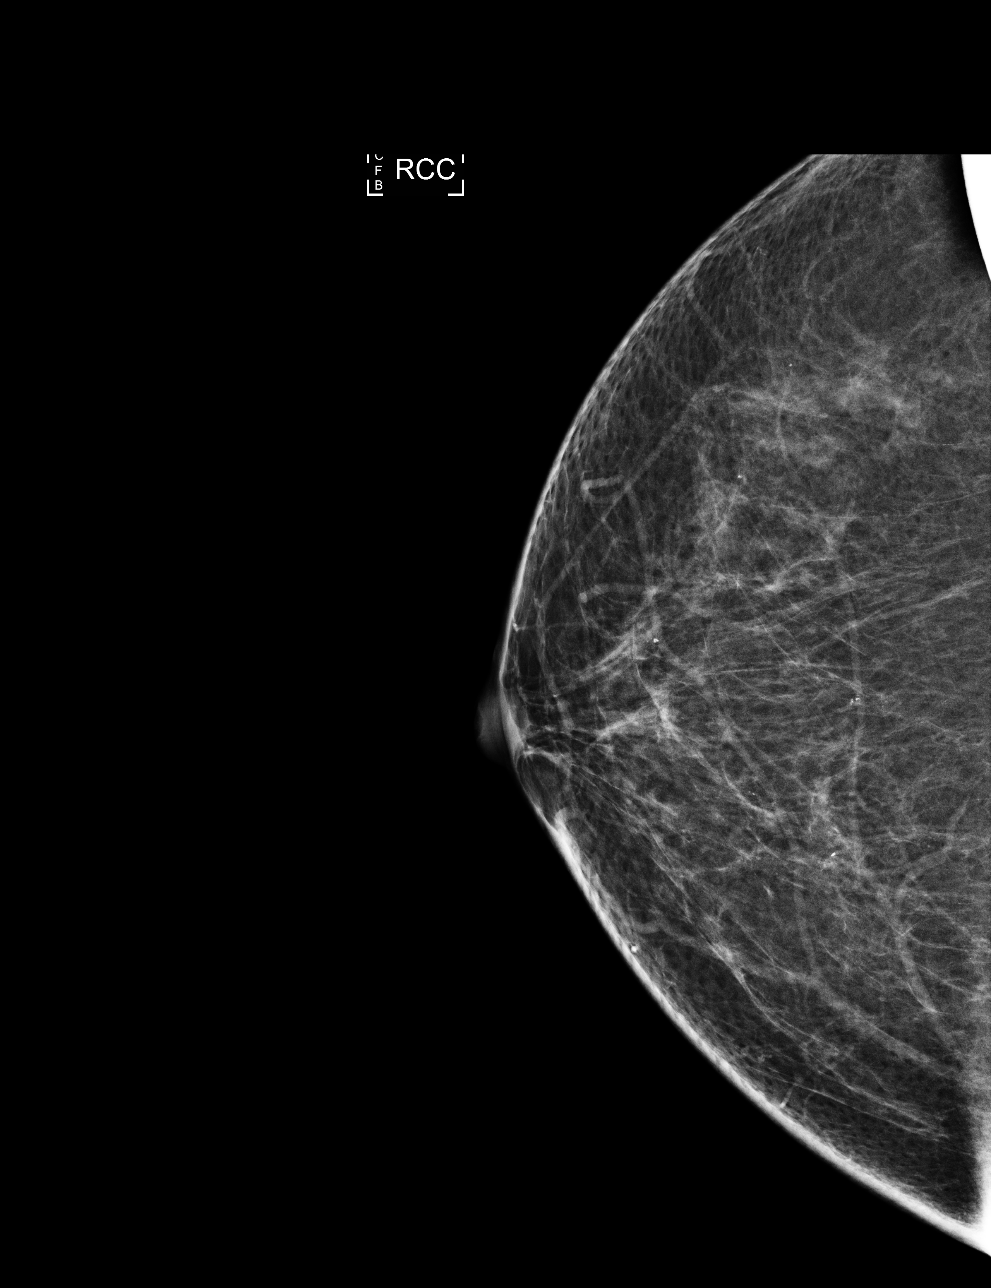

[4 of 4 positions shown; findings below may reference images not displayed]

ACR Breast Density Category b: There are scattered areas of
fibroglandular density.
FINDINGS: There are no findings suspicious for malignancy. The images were
evaluated with computer-aided detection.
IMPRESSION: No mammographic evidence of malignancy. A result letter of this
screening mammogram will be mailed directly to the patient.

RECOMMENDATION:
Screening mammogram in one year. (Code:5O-K-CQ0)

BI-RADS CATEGORY  1: Negative.

## 2021-08-07 ENCOUNTER — Other Ambulatory Visit: Payer: Self-pay | Admitting: Family Medicine

## 2021-08-11 ENCOUNTER — Other Ambulatory Visit: Payer: Self-pay | Admitting: Family Medicine

## 2021-09-06 ENCOUNTER — Other Ambulatory Visit: Payer: Self-pay | Admitting: Family Medicine

## 2022-01-11 ENCOUNTER — Other Ambulatory Visit: Payer: Self-pay | Admitting: Family Medicine

## 2022-01-11 DIAGNOSIS — E1159 Type 2 diabetes mellitus with other circulatory complications: Secondary | ICD-10-CM

## 2022-01-19 ENCOUNTER — Other Ambulatory Visit: Payer: Self-pay

## 2022-01-19 DIAGNOSIS — E1159 Type 2 diabetes mellitus with other circulatory complications: Secondary | ICD-10-CM

## 2022-01-20 LAB — BASIC METABOLIC PANEL
BUN/Creatinine Ratio: 25 (ref 12–28)
BUN: 23 mg/dL (ref 8–27)
CO2: 24 mmol/L (ref 20–29)
Calcium: 9.8 mg/dL (ref 8.7–10.3)
Chloride: 93 mmol/L — ABNORMAL LOW (ref 96–106)
Creatinine, Ser: 0.93 mg/dL (ref 0.57–1.00)
Glucose: 329 mg/dL — ABNORMAL HIGH (ref 70–99)
Potassium: 4.4 mmol/L (ref 3.5–5.2)
Sodium: 133 mmol/L — ABNORMAL LOW (ref 134–144)
eGFR: 65 mL/min/{1.73_m2} (ref >59)

## 2022-03-02 ENCOUNTER — Other Ambulatory Visit: Payer: Self-pay | Admitting: Family Medicine

## 2022-03-02 DIAGNOSIS — E7800 Pure hypercholesterolemia, unspecified: Secondary | ICD-10-CM

## 2022-03-02 DIAGNOSIS — E7889 Other lipoprotein metabolism disorders: Secondary | ICD-10-CM

## 2022-03-02 DIAGNOSIS — E890 Postprocedural hypothyroidism: Secondary | ICD-10-CM

## 2022-03-02 DIAGNOSIS — E119 Type 2 diabetes mellitus without complications: Secondary | ICD-10-CM

## 2022-03-02 DIAGNOSIS — E1159 Type 2 diabetes mellitus with other circulatory complications: Secondary | ICD-10-CM

## 2022-03-02 DIAGNOSIS — R635 Abnormal weight gain: Secondary | ICD-10-CM

## 2022-03-08 ENCOUNTER — Ambulatory Visit (INDEPENDENT_AMBULATORY_CARE_PROVIDER_SITE_OTHER): Payer: BLUE CROSS/BLUE SHIELD | Admitting: Student

## 2022-03-08 VITALS — BP 126/75 | HR 77 | Temp 98.7°F | Wt 161.6 lb

## 2022-03-08 DIAGNOSIS — J069 Acute upper respiratory infection, unspecified: Secondary | ICD-10-CM | POA: Diagnosis not present

## 2022-03-08 MED ORDER — ACETAMINOPHEN 500 MG PO TABS: 500.0000 mg | ORAL_TABLET | Freq: Four times a day (QID) | ORAL | 0 refills | Status: AC | PRN

## 2022-03-08 MED ORDER — BENZONATATE 100 MG PO CAPS: 100.0000 mg | ORAL_CAPSULE | Freq: Two times a day (BID) | ORAL | 0 refills | Status: AC | PRN

## 2022-03-08 NOTE — Patient Instructions (Addendum)
  Fue maravilloso conocerte hoy. Gracias por permitirme ser parte de su cuidado. A continuacin se muestra un breve resumen de lo que discutimos en su visita de hoy:  Sus sntomas son consistentes con una enfermedad viral de las vas respiratorias superiores.  Recomiendo el uso continuado de agua tibia, miel y limn para la tos.  Tambin puedes tomar ibuprofeno o Tylenol para la fiebre.  Le envi una receta de Tylenol y Tessalon Perles que podran ayudarlo con la tos.  Asegrese de [employer] hidratado y contine bebiendo mucho lquido.  Sheela Stack todos sus medicamentos a cada cita!  Si tiene alguna pregunta o inquietud, no dude en comunicarse con nosotros por telfono o mensaje de MyChart.  Dr. Angie Fava de [facility]   It was wonderful to meet you today. Thank you for allowing me to be a part of your care. Below is a short summary of what we discussed at your visit today:  Your symptoms are consistent with upper respiratory viral illness.  I recommend continued use of warm water, honey and lemon for the cough.  You can also do ibuprofen or Tylenol for fever.  I have sent in prescription for Tylenol and Tessalon Perles which could help with your cough.  Please make sure you are staying well-hydrated and continue to drink lots of fluid.  Please bring all of your medications to every appointment!  If you have any questions or concerns, please do not hesitate to contact us via phone or MyChart message.   Alen Bleacher, MD [facility]

## 2022-03-08 NOTE — Progress Notes (Signed)
    SUBJECTIVE:   CHIEF COMPLAINT / HPI:   Patient is a 72 year old female presenting today for recent illness. Per  patient symptom started with body ache 3 days ago.  No fever  but  associated symptoms include sore throat, cough and running nose. She has had decreased appetite but drinking adequately.  Known recent sick contact in grand daughter whose's been sick. No ear pain, vomiting or diarrhea.  Patient uptodate on Flu and COVID vaccine.    Video spanish interpreter: Roderic Palau [phone]  PERTINENT  PMH / PSH: Reviewed  OBJECTIVE:   BP 126/75   Pulse 77   Temp 98.7 F (37.1 C)   Wt 161 lb 9.6 oz (73.3 kg)   SpO2 97%   BMI 28.63 kg/m    Physical Exam General: Alert, well appearing, NAD HEENT: Mild oropharyngeal erythema, no tonsillar exudate or cervical lymphadenopathy Cardiovascular: RRR, No Murmurs, Normal S2/S2 Respiratory: CTAB, No wheezing or Rales   ASSESSMENT/PLAN:   Viral illness 72 y.o. year old presents with body ache, cough, congestion, rhinorrhea and sore throat.sHE is afebrile today and on exam has oropharyngeal erythema,  good work of breathing on RA and clear breath sounds bilaterally. Overall presentation and exam is consistent with viral URI.   - Discussed conservative management with warm water, honey and lemon. - Recommended tylenol or ibuprofen for fever.  -Rx Tessalon Perles and Tylenol - Encouraged adequate hydration for patient.  - Outline signs and symptoms that will warrant ED visit or return for further assessment.       Alen Bleacher, MD [place]

## 2022-04-05 ENCOUNTER — Other Ambulatory Visit: Payer: Self-pay

## 2022-04-05 ENCOUNTER — Ambulatory Visit (INDEPENDENT_AMBULATORY_CARE_PROVIDER_SITE_OTHER): Payer: BLUE CROSS/BLUE SHIELD | Admitting: Student

## 2022-04-05 VITALS — BP 132/80 | HR 82 | Wt 160.0 lb

## 2022-04-05 DIAGNOSIS — M79622 Pain in left upper arm: Secondary | ICD-10-CM | POA: Insufficient documentation

## 2022-04-05 DIAGNOSIS — M8580 Other specified disorders of bone density and structure, unspecified site: Secondary | ICD-10-CM | POA: Insufficient documentation

## 2022-04-05 DIAGNOSIS — E890 Postprocedural hypothyroidism: Secondary | ICD-10-CM

## 2022-04-05 MED ORDER — LEVOTHYROXINE SODIUM 75 MCG PO TABS: 75.0000 ug | ORAL_TABLET | ORAL | 1 refills | Status: DC

## 2022-04-05 NOTE — Assessment & Plan Note (Signed)
Atraumatic, stable.  Given that pain presents with random positional movements, concern for labral tear.  Rotator cuff seems intact and given she is unable to abduct past 90 degrees, deltoid pathology cannot be ruled out.  Will obtain x-ray and sports med referral for consideration of ultrasound.

## 2022-04-05 NOTE — Progress Notes (Signed)
  SUBJECTIVE:   CHIEF COMPLAINT / HPI:   Left arm pain: pain in left upper arm x 1 month. Pain is off/on, worse when she uses the arm. Does not wake her up at night however she does not sleep on her left side anymore. She has tried putting ointment on which has not helped. Denies weakness, numbness, tingling that radiates. She frequently forgets she has pain but when she does specific motions  PERTINENT  PMH / PSH: HTN, T2DM, HLD, BPPV, hypothyroidism, depression  OBJECTIVE:  BP 132/80   Pulse 82   Wt 160 lb (72.6 kg)   SpO2 98%   BMI 28.34 kg/m  Shoulder/arm, left: TTP noted at the bicipital groove. No evidence of bony deformity, asymmetry, or muscle atrophy. No TTP at [facility] joint. Full active and passive range of motion (60Ext, 90ER /70IR) with the exception of abduction and flexion which stops at 90 degrees. Strength 5/5 throughout. Sensation intact. Peripheral pulses intact. Special Tests: - Jobe (Empty can) test: NEG   - Neer test: unable to perfrom   - Obrien's test: NEG   - Speeds test: NEG  ASSESSMENT/PLAN:  Left upper arm pain Assessment & Plan: Atraumatic, stable.  Given that pain presents with random positional movements, concern for labral tear.  Rotator cuff seems intact and given she is unable to abduct past 90 degrees, deltoid pathology cannot be ruled out.  Will obtain x-ray and sports med referral for consideration of ultrasound.   Orders: -     DG Shoulder Left; Future -     Ambulatory referral to Sports Medicine  Return if symptoms worsen or fail to improve. Wells Guiles, DO 04/05/2022, 3:19 PM PGY-2, [place]

## 2022-04-05 NOTE — Patient Instructions (Signed)
It was great to see you today! Thank you for choosing [facility] for your primary care. Alicia Kirby was seen for left shoulder pain.  Today we addressed: I am concerned you tore a portion of your shoulder.  Lets get an x-ray.  I am also referring you to sports medicine for consideration of an ultrasound.  I have placed an order for left shoulder x-ray.  Please go to [place] at [employer] [facility] or at [facility] to have this completed.  You do not need an appointment, but if you would like to call them beforehand, their number is [phone].  We will contact you with your results afterwards.   If you haven't already, sign up for My Chart to have easy access to your labs results, and communication with your primary care physician.  Call the clinic at [phone] if your symptoms worsen or you have any concerns.  You should return to our clinic Return if symptoms worsen or fail to improve. Please arrive 15 minutes before your appointment to ensure smooth check in process.  We appreciate your efforts in making this happen.  Thank you for allowing me to participate in your care, Wells Guiles, DO 04/05/2022, 2:43 PM PGY-2, [place]

## 2022-04-06 ENCOUNTER — Ambulatory Visit: Payer: BLUE CROSS/BLUE SHIELD | Admitting: Sports Medicine

## 2022-04-06 ENCOUNTER — Ambulatory Visit: Payer: BLUE CROSS/BLUE SHIELD

## 2022-04-06 VITALS — BP 122/78 | Ht 60.63 in | Wt 174.0 lb

## 2022-04-06 DIAGNOSIS — M25512 Pain in left shoulder: Secondary | ICD-10-CM

## 2022-04-06 MED ORDER — METHYLPREDNISOLONE ACETATE 40 MG/ML IJ SUSP
40.0000 mg | Freq: Once | INTRAMUSCULAR | Status: AC
  Administered 2022-04-06: 40 mg via INTRA_ARTICULAR

## 2022-04-06 NOTE — Progress Notes (Signed)
PCP: Ezequiel Essex, MD  Subjective:   HPI: Patient is a 72 y.o. female here for left upper arm/shoulder pain.  In person Spanish interpreter present.  Patient seen at her PCP office yesterday for this issue and was referred to sports medicine.  X-ray of the shoulder was ordered but not completed yet. Patient reports that she has had intermittent pain in the left upper arm/shoulder for the past 3 months. No known injury.  Started out as a muscle ache similar to after receiving a vaccination. No pain at rest, only has pain with certain movements. She has difficulty with reaching overhead and reaching behind her due to pain. Unable to sleep on her left side anymore.  She has had difficulty performing household chores due to pain and limited range of motion.  Past Medical History:  Diagnosis Date   Cancer [facility])    Diabetes mellitus without complication ([place])    Healthcare maintenance    Impacted cerumen of right ear 04/15/2020   Low vitamin B12 level 02/05/2020   Instructed to start taking B12 approx 2021.   Panic attack 02/05/2020   Late Dec. 2021. Infrequent.    Viral upper respiratory tract infection with cough 02/23/2020    Current Outpatient Medications on File Prior to Visit  Medication Sig Dispense Refill   acetaminophen (TYLENOL) 500 MG tablet Take 1 tablet (500 mg total) by mouth every 6 (six) hours as needed. 30 tablet 0   amLODipine (NORVASC) 2.5 MG tablet TAKE 1 TABLET BY MOUTH EVERY DAY 90 tablet 3   aspirin EC 81 MG tablet Take 81 mg by mouth daily. Swallow whole. (Patient not taking: Reported on 04/04/2021)     benzonatate (TESSALON) 100 MG capsule Take 1 capsule (100 mg total) by mouth 2 (two) times daily as needed for cough. 20 capsule 0   hydrochlorothiazide (HYDRODIURIL) 25 MG tablet TAKE 1 TABLET (25 MG TOTAL) BY MOUTH DAILY. 90 tablet 1   levothyroxine (SYNTHROID) 75 MCG tablet Take 1 tablet (75 mcg total) by mouth every morning. 30 minutes before food. Return to  doctor in April for next thyroid check. 30 tablet 1   lisinopril (ZESTRIL) 40 MG tablet TAKE 1 TABLET BY MOUTH EVERY DAY 90 tablet 1   metFORMIN (GLUCOPHAGE) 500 MG tablet Take 1 tablet (500 mg total) by mouth daily with breakfast. 90 tablet 3   vitamin B-12 (CYANOCOBALAMIN) 1000 MCG tablet TAKE 1 TABLET BY MOUTH EVERY DAY 100 tablet 3   No current facility-administered medications on file prior to visit.    Past Surgical History:  Procedure Laterality Date   BACK SURGERY     Historical, per patient. "Back surgery" on L2 and L3 vertebrae.    CESAREAN SECTION     Three total: 1974, 1980, [place]     Per patient, benign ovarian tumor removla. Unknown year.    TOTAL THYROIDECTOMY  01/29/2017   Historical, reported by patient. Performed in [place].    TUBAL LIGATION  01/30/1980    No Known Allergies  Social History   Socioeconomic History   Marital status: Single    Spouse name: Not on file   Number of children: Not on file   Years of education: Not on file   Highest education level: Not on file  Occupational History   Not on file  Tobacco Use   Smoking status: Never    Passive exposure: Never   Smokeless tobacco: Never  Substance and Sexual Activity   Alcohol  use: Not on file   Drug use: Not on file   Sexual activity: Not on file  Other Topics Concern   Not on file  Social History Narrative   Not on file   Social Determinants of Health   Financial Resource Strain: Not on file  Food Insecurity: Not on file  Transportation Needs: Not on file  Physical Activity: Not on file  Stress: Not on file  Social Connections: Not on file  Intimate Partner Violence: Not on file    No family history on file.  BP 122/78   Ht 5' 0.63" (1.54 m)   Wt 174 lb (78.9 kg)   BMI 33.28 kg/m   Review of Systems: See HPI above.     Objective:  Physical Exam:  Gen: awake, alert, NAD, comfortable in exam room Pulm: breathing unlabored  Left shoulder: No obvious  deformity or swelling noted.  No tenderness to palpation.  Flexion limited to about 90 degrees secondary to pain.  Abduction limited to less than 90 degrees secondary to pain.  5/5 strength with resisted internal and external rotation.  Some weakness noted with empty can test.  Unable to perform Neer's test due to limited ROM.  MSK ultrasound of the left shoulder performed: Biceps tendon unremarkable.  Small amount of fluid noted in the supraspinatus tendon.  Small amount of fluid noted in the infraspinatus tendon.  No signs of complete tear.  Shoulder Injection Procedure Note  Pre-operative Diagnosis: left rotator cuff tendinopathy  Post-operative Diagnosis: same  Indications: Rotator cuff tendinopathy, pain relief  Anesthesia: none  Procedure Details   Written consent was obtained for the procedure. The shoulder was prepped with alcohol swab and the skin was anesthetized with cold spray. Using a 22 gauge needle the subacromial space is injected with 4 mL 1% lidocaine and 1 mL of  methylprednisolone (Depo-Medrol) 40 mg  under the posterior aspect of the acromion. The injection site dressing was applied.  Complications:  None; patient tolerated the procedure well.    Assessment & Plan:  1.  Rotator cuff tendinopathy, left Atraumatic left shoulder pain ongoing for 3 months notably limited ROM on exam.  Ultrasound does show some evidence of rotator cuff tendinopathy without rupture. - left subacromial corticosteroid injection performed, see procedure note above - shoulder exercises provided - if not improving, could consider MRI  [name], MD [place], PGY-3  FELLOW ATTESTATION: I personally evaluated the patient with the resident and agree with the above documentation with the following emphasis: Atraumatic left shoulder pain that is really impacting her ability to perform activities of daily living.  She has difficulty with overhead movements and reaching  behind her back.  Her ultrasound exam does show some hypoechoic changes at the infraspinatus tendon and mild subacromial bursitis above the supraspinatus tendon.  Will try to treat her pain today with a subacromial steroid injection followed by some home exercises.  Will have her follow-up with [place] in about 4 to 6 weeks.  If no improvement get MRI of her shoulder to evaluate the integrity of her cuff.  Dortha Kern, MD 04/06/2022  I was the preceptor for this visit and available for immediate consultation Shellia Cleverly, DO

## 2022-04-18 ENCOUNTER — Ambulatory Visit: Payer: BLUE CROSS/BLUE SHIELD | Admitting: Sports Medicine

## 2022-05-04 ENCOUNTER — Other Ambulatory Visit: Payer: Self-pay | Admitting: Family Medicine

## 2022-06-15 ENCOUNTER — Telehealth: Payer: Self-pay

## 2022-06-15 DIAGNOSIS — E890 Postprocedural hypothyroidism: Secondary | ICD-10-CM

## 2022-06-15 DIAGNOSIS — E1159 Type 2 diabetes mellitus with other circulatory complications: Secondary | ICD-10-CM

## 2022-06-15 DIAGNOSIS — E119 Type 2 diabetes mellitus without complications: Secondary | ICD-10-CM

## 2022-06-15 MED ORDER — AMLODIPINE BESYLATE 2.5 MG PO TABS: 2.5000 mg | ORAL_TABLET | Freq: Every day | ORAL | 3 refills | Status: AC

## 2022-06-15 MED ORDER — VITAMIN B-12 1000 MCG PO TABS: 1000.0000 ug | ORAL_TABLET | Freq: Every day | ORAL | 3 refills | Status: AC

## 2022-06-15 MED ORDER — LEVOTHYROXINE SODIUM 75 MCG PO TABS: 75.0000 ug | ORAL_TABLET | ORAL | 3 refills | Status: DC

## 2022-06-15 MED ORDER — LISINOPRIL 40 MG PO TABS: 40.0000 mg | ORAL_TABLET | Freq: Every day | ORAL | 3 refills | Status: AC

## 2022-06-15 MED ORDER — METFORMIN HCL 500 MG PO TABS: 500.0000 mg | ORAL_TABLET | Freq: Every day | ORAL | 3 refills | Status: AC

## 2022-06-15 MED ORDER — HYDROCHLOROTHIAZIDE 25 MG PO TABS: 25.0000 mg | ORAL_TABLET | Freq: Every day | ORAL | 1 refills | Status: AC

## 2022-06-15 NOTE — Telephone Encounter (Signed)
Patients son calls nurse line requesting a 90 day supply of all medications.  [place] will [place].   Will forward to PCP.

## 2022-06-15 NOTE — Telephone Encounter (Signed)
Refill of all medications sent in.   Fayette Pho, MD

## 2022-06-19 ENCOUNTER — Telehealth: Payer: Self-pay | Admitting: Family Medicine

## 2022-06-19 DIAGNOSIS — E890 Postprocedural hypothyroidism: Secondary | ICD-10-CM

## 2022-06-19 NOTE — Telephone Encounter (Signed)
Patient came in stating that she needs her Synthroid refilled for a 3 month supply since she is going out of the country. She went to pick it up from the pharmacy, but it was only filled for a 1 month supply

## 2022-06-20 MED ORDER — LEVOTHYROXINE SODIUM 75 MCG PO TABS
75.0000 ug | ORAL_TABLET | ORAL | 3 refills | Status: AC
Start: 2022-06-20 — End: ?

## 2022-06-20 NOTE — Telephone Encounter (Signed)
90 day script sent.  Fayette Pho, MD
